# Patient Record
Sex: Female | Born: 2000 | Race: Black or African American | Hispanic: No | Marital: Single | Smoking: Current every day smoker
Health system: Southern US, Community
[De-identification: ages and names within clinical notes are randomized; demographics above are authoritative.]

---
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
## 2018-03-02 ENCOUNTER — Emergency Department (HOSPITAL_COMMUNITY)
Admission: EM | Admit: 2018-03-02 | Discharge: 2018-03-02 | Disposition: A | Discharge status: Home / Self Care | Payer: Medicaid Other | Attending: Emergency Medicine | Admitting: Emergency Medicine

## 2018-03-02 DIAGNOSIS — Y939 Activity, unspecified: Secondary | ICD-10-CM | POA: Insufficient documentation

## 2018-03-02 DIAGNOSIS — Y998 Other external cause status: Secondary | ICD-10-CM | POA: Insufficient documentation

## 2018-03-02 DIAGNOSIS — Y92410 Unspecified street and highway as the place of occurrence of the external cause: Secondary | ICD-10-CM | POA: Diagnosis not present

## 2018-03-02 DIAGNOSIS — M542 Cervicalgia: Secondary | ICD-10-CM | POA: Diagnosis not present

## 2018-03-02 DIAGNOSIS — Z79899 Other long term (current) drug therapy: Secondary | ICD-10-CM | POA: Insufficient documentation

## 2018-03-02 DIAGNOSIS — F17210 Nicotine dependence, cigarettes, uncomplicated: Secondary | ICD-10-CM | POA: Insufficient documentation

## 2018-03-02 MED ORDER — CYCLOBENZAPRINE HCL 10 MG PO TABS: 5.0000 mg | ORAL_TABLET | Freq: Two times a day (BID) | ORAL | 0 refills | Status: DC | PRN
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
## 2018-03-02 NOTE — Discharge Instructions (Signed)
Take tylenol for your pain.  Return to the emergency department immediately if you develop any of the following symptoms: You have numbness, tingling, or weakness in the arms or legs. You develop severe headaches not relieved with medicine. You have severe neck pain, especially tenderness in the middle of the back of your neck. You have changes in bowel or bladder control. There is increasing pain in any area of the body. You have shortness of breath, light-headedness, dizziness, or fainting. You have chest pain. You feel sick to your stomach (nauseous), throw up (vomit), or sweat. You have increasing abdominal discomfort. There is blood in your urine, stool, or vomit. You have pain in your shoulder (shoulder strap areas). You feel your symptoms are getting worse.
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
## 2019-07-11 ENCOUNTER — Emergency Department (HOSPITAL_COMMUNITY): Payer: Medicaid Other

## 2019-07-11 ENCOUNTER — Ambulatory Visit (HOSPITAL_COMMUNITY)
Admission: EM | Admit: 2019-07-11 | Discharge: 2019-07-11 | Disposition: A | Discharge status: Home / Self Care | Payer: Medicaid Other | Attending: Family Medicine | Admitting: Family Medicine

## 2019-07-11 ENCOUNTER — Emergency Department (HOSPITAL_COMMUNITY)
Admission: EM | Admit: 2019-07-11 | Discharge: 2019-07-12 | Disposition: A | Discharge status: Other Acute Inpatient Hospital | Payer: Medicaid Other | Attending: Emergency Medicine | Admitting: Emergency Medicine

## 2019-07-11 ENCOUNTER — Other Ambulatory Visit: Payer: Self-pay

## 2019-07-11 ENCOUNTER — Encounter (HOSPITAL_COMMUNITY): Payer: Self-pay

## 2019-07-11 DIAGNOSIS — Z20822 Contact with and (suspected) exposure to covid-19: Secondary | ICD-10-CM | POA: Diagnosis not present

## 2019-07-11 DIAGNOSIS — R1011 Right upper quadrant pain: Secondary | ICD-10-CM

## 2019-07-11 DIAGNOSIS — F329 Major depressive disorder, single episode, unspecified: Secondary | ICD-10-CM

## 2019-07-11 DIAGNOSIS — R160 Hepatomegaly, not elsewhere classified: Secondary | ICD-10-CM | POA: Insufficient documentation

## 2019-07-11 DIAGNOSIS — F17210 Nicotine dependence, cigarettes, uncomplicated: Secondary | ICD-10-CM | POA: Insufficient documentation

## 2019-07-11 DIAGNOSIS — R45851 Suicidal ideations: Secondary | ICD-10-CM | POA: Diagnosis present

## 2019-07-11 DIAGNOSIS — F32A Depression, unspecified: Secondary | ICD-10-CM

## 2019-07-11 DIAGNOSIS — Z3202 Encounter for pregnancy test, result negative: Secondary | ICD-10-CM

## 2019-07-11 DIAGNOSIS — F332 Major depressive disorder, recurrent severe without psychotic features: Secondary | ICD-10-CM | POA: Insufficient documentation

## 2019-07-11 DIAGNOSIS — G8929 Other chronic pain: Secondary | ICD-10-CM | POA: Diagnosis present

## 2019-07-11 DIAGNOSIS — R1013 Epigastric pain: Secondary | ICD-10-CM | POA: Insufficient documentation

## 2019-07-11 LAB — COMPREHENSIVE METABOLIC PANEL
ALT: 14 U/L (ref 0–44)
AST: 17 U/L (ref 15–41)
Albumin: 4.9 g/dL (ref 3.5–5.0)
Alkaline Phosphatase: 62 U/L (ref 38–126)
Anion gap: 8 (ref 5–15)
BUN: <5 mg/dL — ABNORMAL LOW (ref 6–20)
CO2: 23 mmol/L (ref 22–32)
Calcium: 9.6 mg/dL (ref 8.9–10.3)
Chloride: 109 mmol/L (ref 98–111)
Creatinine, Ser: 0.72 mg/dL (ref 0.44–1.00)
GFR calc Af Amer: >60 mL/min (ref >60)
GFR calc non Af Amer: >60 mL/min (ref >60)
Glucose, Bld: 89 mg/dL (ref 70–99)
Potassium: 3.5 mmol/L (ref 3.5–5.1)
Sodium: 140 mmol/L (ref 135–145)
Total Bilirubin: 0.5 mg/dL (ref 0.3–1.2)
Total Protein: 8 g/dL (ref 6.5–8.1)

## 2019-07-11 LAB — ETHANOL: Alcohol, Ethyl (B): <10 mg/dL (ref <10)

## 2019-07-11 LAB — POCT URINALYSIS DIP (DEVICE)
Bilirubin Urine: NEGATIVE
Glucose, UA: NEGATIVE mg/dL
Ketones, ur: NEGATIVE mg/dL
Leukocytes,Ua: NEGATIVE
Nitrite: NEGATIVE
Protein, ur: NEGATIVE mg/dL
Specific Gravity, Urine: >=1.03 (ref 1.005–1.030)
Urobilinogen, UA: 0.2 mg/dL (ref 0.0–1.0)
pH: 6.5 (ref 5.0–8.0)

## 2019-07-11 LAB — CBC
HCT: 44.4 % (ref 36.0–46.0)
Hemoglobin: 14.3 g/dL (ref 12.0–15.0)
MCH: 31.6 pg (ref 26.0–34.0)
MCHC: 32.2 g/dL (ref 30.0–36.0)
MCV: 98.2 fL (ref 80.0–100.0)
Platelets: 493 10*3/uL — ABNORMAL HIGH (ref 150–400)
RBC: 4.52 MIL/uL (ref 3.87–5.11)
RDW: 12 % (ref 11.5–15.5)
WBC: 5.3 10*3/uL (ref 4.0–10.5)
nRBC: 0 % (ref 0.0–0.2)

## 2019-07-11 LAB — SALICYLATE LEVEL: Salicylate Lvl: <7 mg/dL — ABNORMAL LOW (ref 7.0–30.0)

## 2019-07-11 LAB — I-STAT BETA HCG BLOOD, ED (MC, WL, AP ONLY): I-stat hCG, quantitative: <5 m[IU]/mL (ref <5)

## 2019-07-11 LAB — POCT PREGNANCY, URINE: Preg Test, Ur: NEGATIVE

## 2019-07-11 LAB — ACETAMINOPHEN LEVEL: Acetaminophen (Tylenol), Serum: <10 ug/mL — ABNORMAL LOW (ref 10–30)

## 2019-07-11 LAB — POC URINE PREG, ED: Preg Test, Ur: NEGATIVE

## 2019-07-11 MED ORDER — CYCLOBENZAPRINE HCL 10 MG PO TABS: 5.0000 mg | ORAL_TABLET | Freq: Two times a day (BID) | ORAL | Status: DC | PRN

## 2019-07-11 MED ORDER — ALUM & MAG HYDROXIDE-SIMETH 200-200-20 MG/5ML PO SUSP
30.0000 mL | Freq: Once | ORAL | Status: AC
  Administered 2019-07-11: 30 mL via ORAL
  Filled 2019-07-11: qty 30

## 2019-07-11 MED ORDER — QUETIAPINE FUMARATE 50 MG PO TABS
250.0000 mg | ORAL_TABLET | Freq: Every day | ORAL | Status: DC
  Administered 2019-07-12: 250 mg via ORAL
  Filled 2019-07-11: qty 2

## 2019-07-11 MED ORDER — TRAZODONE HCL 100 MG PO TABS: 100.0000 mg | ORAL_TABLET | Freq: Every evening | ORAL | Status: DC | PRN

## 2019-07-11 MED ORDER — LIDOCAINE VISCOUS HCL 2 % MT SOLN
15.0000 mL | Freq: Once | OROMUCOSAL | Status: AC
  Administered 2019-07-11: 15 mL via ORAL
  Filled 2019-07-11: qty 15

## 2019-07-11 MED ORDER — FLUOXETINE HCL 20 MG PO CAPS
20.0000 mg | ORAL_CAPSULE | Freq: Every day | ORAL | Status: DC
  Administered 2019-07-12: 20 mg via ORAL
  Filled 2019-07-11: qty 1

## 2019-07-11 MED ORDER — ACETAMINOPHEN 500 MG PO TABS
1000.0000 mg | ORAL_TABLET | Freq: Once | ORAL | Status: AC
  Administered 2019-07-11: 1000 mg via ORAL
  Filled 2019-07-11: qty 2

## 2019-07-11 MED ORDER — HYDROXYZINE HCL 25 MG PO TABS: 25.0000 mg | ORAL_TABLET | Freq: Four times a day (QID) | ORAL | Status: DC | PRN

## 2019-07-11 NOTE — ED Triage Notes (Cosign Needed)
Pt present abdominal pain with urinary frequency. Symptom been going on for over 5 months. Pt states she is having some burning while urinating and  A bowel movement 2x a week.

## 2019-07-11 NOTE — ED Triage Notes (Signed)
Patient reports she is suicidal, but has no plan. Patient then mentioned she went to the [facility] yesterday, but would not elaborate.  Patient c/o lower abdominal pain x 5 months. Paient also c/o dysuria x 5 months. Patient states she has not seen about this.

## 2019-07-11 NOTE — ED Provider Notes (Signed)
[facility]-EMERGENCY DEPT [facility] Emergency Department Provider Note MRN:  [id number]  Arrival date & time: 07/11/19     Chief Complaint   Suicidal and Abdominal Pain   History of Present Illness   Paula Gonzalez is a 19 y.o. year-old female with no pertinent past medical history presenting to the ED with chief complaint of abdominal pain.  At least 3 months of abdominal pain that is worst in the right upper quadrant.  Intermittent.  Her pain has been causing her to have depression.  Noted some thoughts of suicide with urgent care provider earlier today.  Denies fever, nausea but no vomiting.  Patient is withdrawn and not open to answering many questions.  I was unable to obtain an accurate HPI, PMH, or ROS due to the patient's depression.  Level 5 caveat.  Review of Systems  Positive for abdominal pain, depression, suicidality.  Patient's Health History   History reviewed. No pertinent past medical history.  History reviewed. No pertinent surgical history.  Family History  Family history unknown: Yes    Social History   Socioeconomic History  . Marital status: Single    Spouse name: Not on file  . Number of children: Not on file  . Years of education: Not on file  . Highest education level: Not on file  Occupational History  . Not on file  Tobacco Use  . Smoking status: Current Every Day Smoker    Packs/day: 0.50    Types: Cigarettes  . Smokeless tobacco: Never Used  Substance and Sexual Activity  . Alcohol use: Yes    Comment: drink socially  . Drug use: Yes    Types: Marijuana  . Sexual activity: Not on file  Other Topics Concern  . Not on file  Social History Narrative  . Not on file   Social Determinants of Health   Financial Resource Strain:   . Difficulty of Paying Living Expenses: Not on file  Food Insecurity:   . Worried About Programme researcher, broadcasting/film/video in the Last Year: Not on file  . Ran Out of Food in the Last Year: Not on file  Transportation Needs:    . Lack of Transportation (Medical): Not on file  . Lack of Transportation (Non-Medical): Not on file  Physical Activity:   . Days of Exercise per Week: Not on file  . Minutes of Exercise per Session: Not on file  Stress:   . Feeling of Stress : Not on file  Social Connections:   . Frequency of Communication with Friends and Family: Not on file  . Frequency of Social Gatherings with Friends and Family: Not on file  . Attends Religious Services: Not on file  . Active Member of Clubs or Organizations: Not on file  . Attends Banker Meetings: Not on file  . Marital Status: Not on file  Intimate Partner Violence:   . Fear of Current or Ex-Partner: Not on file  . Emotionally Abused: Not on file  . Physically Abused: Not on file  . Sexually Abused: Not on file     Physical Exam   Vitals:   07/11/19 1403  BP: 122/82  Pulse: (!) 115  Resp: 16  Temp: 98.5 F (36.9 C)  SpO2: 100%    CONSTITUTIONAL: Well-appearing, NAD NEURO:  Alert and oriented x 3, no focal deficits EYES:  eyes equal and reactive ENT/NECK:  no LAD, no JVD CARDIO: Tachycardic rate, well-perfused, normal S1 and S2 PULM:  CTAB no wheezing or rhonchi  GI/GU:  normal bowel sounds, non-distended, mild right upper quadrant tenderness to palpation MSK/SPINE:  No gross deformities, no edema SKIN:  no rash, atraumatic PSYCH:  Appropriate speech and behavior  *Additional and/or pertinent findings included in MDM below  Diagnostic and Interventional Summary    EKG Interpretation  Date/Time:    Ventricular Rate:    PR Interval:    QRS Duration:   QT Interval:    QTC Calculation:   R Axis:     Text Interpretation:        Cardiac Monitoring Interpretation:  Labs Reviewed  COMPREHENSIVE METABOLIC PANEL - Abnormal; Notable for the following components:      Result Value   BUN <5 (*)    All other components within normal limits  SALICYLATE LEVEL - Abnormal; Notable for the following components:    Salicylate Lvl <7.4 (*)    All other components within normal limits  ACETAMINOPHEN LEVEL - Abnormal; Notable for the following components:   Acetaminophen (Tylenol), Serum <10 (*)    All other components within normal limits  CBC - Abnormal; Notable for the following components:   Platelets 493 (*)    All other components within normal limits  ETHANOL  RAPID URINE DRUG SCREEN, HOSP PERFORMED  URINALYSIS, ROUTINE W REFLEX MICROSCOPIC  LIPASE, BLOOD  I-STAT BETA HCG BLOOD, ED ([facility], [facility], [facility] ONLY)    US Abdomen Limited RUQ  Final Result      Medications  acetaminophen (TYLENOL) tablet 1,000 mg (1,000 mg Oral Given 07/11/19 1605)  alum & mag hydroxide-simeth (MAALOX/MYLANTA) 200-200-20 MG/5ML suspension 30 mL (30 mLs Oral Given 07/11/19 1605)    And  lidocaine (XYLOCAINE) 2 % viscous mouth solution 15 mL (15 mLs Oral Given 07/11/19 1605)     Procedures  /  Critical Care Procedures  ED Course and Medical Decision Making  I have reviewed the triage vital signs, the nursing notes, and pertinent available records from the EMR.  Pertinent labs & imaging results that were available during my care of the patient were reviewed by me and considered in my medical decision making (see below for details).     Awaiting laboratory assessment to exclude liver function impairment, will also obtain right upper quadrant ultrasound to exclude biliary stones as the cause of patient's chronic intermittent pain.  Without a significant medical diagnosis after this work-up, will defer to TTS for management of depression with suicidal thoughts.  5:26 PM update: Work-up is reassuring, no acute medical process evident.  No evidence of biliary colic or cholecystitis on ultrasound.  Incidental liver nodule or mass, likely hemangioma.  Patient made aware of this finding and need for outpatient MRI imaging.  Will refer to GI.  Patient is now appropriate for TTS evaluation.  Signed out to default provider.  Barth Kirks.  Sedonia Small, [place] [email or web address]  Final Clinical Impressions(s) / ED Diagnoses     ICD-10-CM   1. Depression, unspecified depression type  F32.9   2. RUQ pain  R10.11 US Abdomen Limited RUQ    US Abdomen Limited RUQ  3. Suicidal ideation  R45.851   4. Liver mass  R16.0     ED Discharge Orders    None       Discharge Instructions Discussed with and Provided to Patient:   Discharge Instructions   None       Maudie Flakes, MD 07/11/19 1729
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
## 2019-07-11 NOTE — ED Notes (Signed)
Patient has been wanded my security. Patient has one belonging back behind the triage desk

## 2019-07-11 NOTE — ED Notes (Signed)
Pt continues to stay balled up in chair, no s/s of distress. Continue to observe

## 2019-07-11 NOTE — ED Notes (Signed)
US at bedside

## 2019-07-11 NOTE — ED Provider Notes (Signed)
[place]   [id number] 07/11/19 Arrival Time: 1110  ASSESSMENT & PLAN:  1. Severe episode of recurrent major depressive disorder, without psychotic features ([place])   2. Passive suicidal ideations   3. Abdominal pain, chronic, epigastric     Initial conversation regarding abdominal symptoms quickly deteriorated into Ms Pozo expressing thoughts of committing suicide. Reports "I just feel tired and don't want to talk anymore". After this she does not answer many of my questions and refuses to make a verbal contract with me in regards to seeking mental heath care. No specific suicidal plan reported. Her mother was brought to the room where the current situation was explained. She agrees to take her directly to [facility] ED for evaluation. Agrees to not leave Arriyah alone. Discharged into mother's care.    Follow-up Information    Schedule an appointment as soon as possible for a visit  with Gastroenterology, [facility].   Contact information: [place] [id number] [phone]        Schedule an appointment as soon as possible for a visit  with [facility] Gastroenterology.   Specialty: Gastroenterology Contact information: [place] [id number] [phone]       Go to  [place] DEPT.   Specialty: Emergency Medicine Contact information: [place] [id number] [place] [phone]          Reviewed expectations re: course of current medical issues. Questions answered. Outlined signs and symptoms indicating need for more acute intervention. Understanding verbalized. After Visit Summary given.   SUBJECTIVE: History from: patient and caregiver. Kamariyah Timberlake is a 19 y.o. female who reports 5-6 months of chronic abdominal pain and urinary frequency. No recent medical records available here but she reports seeing multiple medical  providers over the past few months including gyn and urology. "They say everything is ok". Reports being frustrated with abdominal pains that limit her at work. Not able to perform her job well. Chart review reveals history of major depression. She nods here head yes when asked if she feels depressed. No currrent street drug use reported. Occasional alcohol use.  Patient's last menstrual period was 06/17/2019.   OBJECTIVE:  Vitals:   07/11/19 1135  BP: 121/71  Pulse: 60  Resp: 16  Temp: 98.3 F (36.8 C)  TempSrc: Oral  SpO2: 100%    General appearance: alert; tearful and quiet spoken Eyes: PERRLA; EOMI; conjunctiva normal HENT: [place]; AT; nasal mucosa normal; oral mucosa normal Neck: supple  Lungs: speaks full sentences without difficulty; unlabored Abdomen: soft with lower TTP reported; poorly localized; no rebound TTP or guarding Extremities: no edema Skin: warm and dry Neurologic: normal gait Psychological: alert and cooperative  Labs: Results for orders placed or performed during the hospital encounter of 07/11/19  POC urine pregnancy  Result Value Ref Range   Preg Test, Ur NEGATIVE NEGATIVE  POCT urinalysis dip (device)  Result Value Ref Range   Glucose, UA NEGATIVE NEGATIVE mg/dL   Bilirubin Urine NEGATIVE NEGATIVE   Ketones, ur NEGATIVE NEGATIVE mg/dL   Specific Gravity, Urine >=1.030 1.005 - 1.030   Hgb urine dipstick TRACE (A) NEGATIVE   pH 6.5 5.0 - 8.0   Protein, ur NEGATIVE NEGATIVE mg/dL   Urobilinogen, UA 0.2 0.0 - 1.0 mg/dL   Nitrite NEGATIVE NEGATIVE   Leukocytes,Ua NEGATIVE NEGATIVE  Pregnancy, urine POC  Result Value Ref Range   Preg Test, Ur NEGATIVE NEGATIVE   Labs  Reviewed  POCT URINALYSIS DIP (DEVICE) - Abnormal; Notable for the following components:      Result Value   Hgb urine dipstick TRACE (*)    All other components within normal limits  URINE CULTURE  POC URINE PREG, ED  POCT PREGNANCY, URINE    No Known Allergies    Social  History   Socioeconomic History  . Marital status: Single    Spouse name: Not on file  . Number of children: Not on file  . Years of education: Not on file  . Highest education level: Not on file  Occupational History  . Not on file  Tobacco Use  . Smoking status: Current Every Day Smoker    Types: Cigarettes  . Smokeless tobacco: Never Used  Substance and Sexual Activity  . Alcohol use: Yes    Comment: drink socially  . Drug use: Yes    Types: Marijuana  . Sexual activity: Not on file  Other Topics Concern  . Not on file  Social History Narrative  . Not on file   Social Determinants of Health   Financial Resource Strain:   . Difficulty of Paying Living Expenses: Not on file  Food Insecurity:   . Worried About Programme researcher, broadcasting/film/video in the Last Year: Not on file  . Ran Out of Food in the Last Year: Not on file  Transportation Needs:   . Lack of Transportation (Medical): Not on file  . Lack of Transportation (Non-Medical): Not on file  Physical Activity:   . Days of Exercise per Week: Not on file  . Minutes of Exercise per Session: Not on file  Stress:   . Feeling of Stress : Not on file  Social Connections:   . Frequency of Communication with Friends and Family: Not on file  . Frequency of Social Gatherings with Friends and Family: Not on file  . Attends Religious Services: Not on file  . Active Member of Clubs or Organizations: Not on file  . Attends Banker Meetings: Not on file  . Marital Status: Not on file  Intimate Partner Violence:   . Fear of Current or Ex-Partner: Not on file  . Emotionally Abused: Not on file  . Physically Abused: Not on file  . Sexually Abused: Not on file   History reviewed. No pertinent family history. History reviewed. No pertinent surgical history.   Mardella Layman, MD 07/11/19 1425

## 2019-07-12 ENCOUNTER — Inpatient Hospital Stay (HOSPITAL_COMMUNITY)
Admission: AD | Admit: 2019-07-12 | Discharge: 2019-07-17 | DRG: 897 | Disposition: A | Discharge status: Home / Self Care | Payer: Medicaid Other | Source: Intra-hospital | Attending: Psychiatry | Admitting: Psychiatry

## 2019-07-12 ENCOUNTER — Encounter (HOSPITAL_COMMUNITY): Payer: Self-pay | Admitting: Family

## 2019-07-12 ENCOUNTER — Other Ambulatory Visit: Payer: Self-pay

## 2019-07-12 DIAGNOSIS — F418 Other specified anxiety disorders: Secondary | ICD-10-CM | POA: Diagnosis present

## 2019-07-12 DIAGNOSIS — F1994 Other psychoactive substance use, unspecified with psychoactive substance-induced mood disorder: Secondary | ICD-10-CM | POA: Diagnosis present

## 2019-07-12 DIAGNOSIS — Z915 Personal history of self-harm: Secondary | ICD-10-CM | POA: Diagnosis not present

## 2019-07-12 DIAGNOSIS — K589 Irritable bowel syndrome without diarrhea: Secondary | ICD-10-CM | POA: Diagnosis present

## 2019-07-12 DIAGNOSIS — F332 Major depressive disorder, recurrent severe without psychotic features: Secondary | ICD-10-CM | POA: Diagnosis not present

## 2019-07-12 DIAGNOSIS — R45851 Suicidal ideations: Secondary | ICD-10-CM | POA: Diagnosis present

## 2019-07-12 DIAGNOSIS — F17210 Nicotine dependence, cigarettes, uncomplicated: Secondary | ICD-10-CM | POA: Diagnosis present

## 2019-07-12 DIAGNOSIS — F4310 Post-traumatic stress disorder, unspecified: Secondary | ICD-10-CM | POA: Diagnosis present

## 2019-07-12 DIAGNOSIS — F1220 Cannabis dependence, uncomplicated: Secondary | ICD-10-CM | POA: Diagnosis present

## 2019-07-12 DIAGNOSIS — F12250 Cannabis dependence with psychotic disorder with delusions: Principal | ICD-10-CM | POA: Diagnosis present

## 2019-07-12 DIAGNOSIS — R160 Hepatomegaly, not elsewhere classified: Secondary | ICD-10-CM | POA: Diagnosis not present

## 2019-07-12 DIAGNOSIS — F23 Brief psychotic disorder: Secondary | ICD-10-CM | POA: Diagnosis present

## 2019-07-12 DIAGNOSIS — F22 Delusional disorders: Secondary | ICD-10-CM | POA: Diagnosis present

## 2019-07-12 LAB — URINALYSIS, ROUTINE W REFLEX MICROSCOPIC
Bilirubin Urine: NEGATIVE
Glucose, UA: NEGATIVE mg/dL
Hgb urine dipstick: NEGATIVE
Ketones, ur: 5 mg/dL — AB
Leukocytes,Ua: NEGATIVE
Nitrite: NEGATIVE
Protein, ur: NEGATIVE mg/dL
Specific Gravity, Urine: 1.019 (ref 1.005–1.030)
pH: 5 (ref 5.0–8.0)

## 2019-07-12 LAB — URINE CULTURE: Culture: <10000 — AB

## 2019-07-12 LAB — RESPIRATORY PANEL BY RT PCR (FLU A&B, COVID)
Influenza A by PCR: NEGATIVE
Influenza B by PCR: NEGATIVE
SARS Coronavirus 2 by RT PCR: NEGATIVE

## 2019-07-12 LAB — RAPID URINE DRUG SCREEN, HOSP PERFORMED
Amphetamines: NOT DETECTED
Barbiturates: NOT DETECTED
Benzodiazepines: NOT DETECTED
Cocaine: NOT DETECTED
Opiates: NOT DETECTED
Tetrahydrocannabinol: POSITIVE — AB

## 2019-07-12 LAB — LIPASE, BLOOD: Lipase: 27 U/L (ref 11–51)

## 2019-07-12 MED ORDER — PANTOPRAZOLE SODIUM 20 MG PO TBEC
20.0000 mg | DELAYED_RELEASE_TABLET | Freq: Every day | ORAL | Status: DC
  Administered 2019-07-13 – 2019-07-14 (×2): 20 mg via ORAL
  Filled 2019-07-12 (×5): qty 1

## 2019-07-12 MED ORDER — POLYETHYLENE GLYCOL 3350 17 G PO PACK
17.0000 g | PACK | Freq: Every day | ORAL | Status: DC | PRN
  Filled 2019-07-12: qty 1

## 2019-07-12 MED ORDER — DOCUSATE SODIUM 100 MG PO CAPS
100.0000 mg | ORAL_CAPSULE | Freq: Every day | ORAL | Status: DC
  Administered 2019-07-12: 100 mg via ORAL
  Filled 2019-07-12: qty 1

## 2019-07-12 MED ORDER — DOCUSATE SODIUM 100 MG PO CAPS
100.0000 mg | ORAL_CAPSULE | Freq: Every day | ORAL | Status: DC
  Administered 2019-07-13 – 2019-07-17 (×4): 100 mg via ORAL
  Filled 2019-07-12 (×7): qty 1

## 2019-07-12 MED ORDER — ACETAMINOPHEN 325 MG PO TABS
650.0000 mg | ORAL_TABLET | Freq: Four times a day (QID) | ORAL | Status: DC | PRN
  Administered 2019-07-13: 650 mg via ORAL
  Filled 2019-07-12: qty 2

## 2019-07-12 MED ORDER — ONDANSETRON HCL 4 MG PO TABS
4.0000 mg | ORAL_TABLET | Freq: Three times a day (TID) | ORAL | Status: DC | PRN
  Filled 2019-07-12: qty 1

## 2019-07-12 MED ORDER — ACETAMINOPHEN 500 MG PO TABS
1000.0000 mg | ORAL_TABLET | Freq: Once | ORAL | Status: AC
  Administered 2019-07-12: 1000 mg via ORAL
  Filled 2019-07-12: qty 2

## 2019-07-12 MED ORDER — HYDROXYZINE HCL 25 MG PO TABS
25.0000 mg | ORAL_TABLET | Freq: Four times a day (QID) | ORAL | Status: DC | PRN
  Administered 2019-07-12: 25 mg via ORAL
  Filled 2019-07-12: qty 1

## 2019-07-12 MED ORDER — MIRTAZAPINE 15 MG PO TABS
15.0000 mg | ORAL_TABLET | Freq: Every day | ORAL | Status: DC
  Administered 2019-07-12 – 2019-07-13 (×2): 15 mg via ORAL
  Filled 2019-07-12 (×5): qty 1

## 2019-07-12 MED ORDER — ONDANSETRON 4 MG PO TBDP
4.0000 mg | ORAL_TABLET | Freq: Three times a day (TID) | ORAL | Status: DC | PRN
  Administered 2019-07-12: 4 mg via ORAL
  Filled 2019-07-12: qty 1

## 2019-07-12 MED ORDER — DICYCLOMINE HCL 10 MG PO CAPS
10.0000 mg | ORAL_CAPSULE | Freq: Three times a day (TID) | ORAL | Status: DC
  Administered 2019-07-13 – 2019-07-14 (×6): 10 mg via ORAL
  Filled 2019-07-12 (×15): qty 1

## 2019-07-12 MED ORDER — ONDANSETRON 4 MG PO TBDP
4.0000 mg | ORAL_TABLET | Freq: Once | ORAL | Status: AC
  Administered 2019-07-12: 4 mg via ORAL
  Filled 2019-07-12: qty 1

## 2019-07-12 MED ORDER — POLYETHYLENE GLYCOL 3350 17 G PO PACK
17.0000 g | PACK | Freq: Every day | ORAL | Status: DC | PRN
  Administered 2019-07-13: 17 g via ORAL
  Filled 2019-07-12: qty 1

## 2019-07-12 NOTE — ED Notes (Signed)
I called patients mother Magda Paganini [phone]) and let her know that patient will be transported to [facility] at 4:00.  Mother was very appreciative for the phone call.

## 2019-07-12 NOTE — Progress Notes (Signed)
Received Paula Gonzalez this PM in her room asleep with the sitter at the bedside. Her mom called and spoke with her and later this writer spoke with her briefly. Mom  is very concerned about her abdomen pain which has cause her to feel depressed. She slept throughout the night.

## 2019-07-12 NOTE — Progress Notes (Signed)
Pt is a 19 year old female who was admitted to room 304-1.  Pt transferred to [facility] from [facility] ED.  Pt reported that she has had 6 months of undiagnosed abdominal pain, constipation, vomiting, and poor appetite.  Pt also complains of burning when urinating.    Pt's ongoing medical issues has caused a significant decline in pt's mood and mental stability.  Pt reports passive SI, with no plan or intent. Water offered, pt declined food. Pt vomited x1 during assessment.  Skin assessment could not be performed due to pt's vomiting.    Pt escorted to room.  Safety checks were initiated.  Admission paperwork was signed by pt, and pt verbalized understanding of plan of care.

## 2019-07-12 NOTE — ED Provider Notes (Signed)
Pt held in ED awaiting psych recommendations. During my shift, I ordered meds for constipation treatment, nausea, and pain. Pt sleeping on exam.  Psychiatry is recommending inpatient treatment. PT will be taken to [facility] once bed is ready.   Lillyanna Glandon, Ambrose Finland, MD 07/12/19 1515

## 2019-07-12 NOTE — Progress Notes (Signed)
Psychoeducational Group Note  Date:  07/12/2019 Time:  2141  Group Topic/Focus:  Wrap-Up Group:   The focus of this group is to help patients review their daily goal of treatment and discuss progress on daily workbooks.  Participation Level: Did Not Attend  Participation Quality:  Not Applicable  Affect:  Not Applicable  Cognitive:  Not Applicable  Insight:  Not Applicable  Engagement in Group: Not Applicable  Additional Comments:  The patient did not attend group this evening since she was not feeling well.   Hazle Coca S 07/12/2019, 9:41 PM

## 2019-07-12 NOTE — ED Notes (Signed)
Renaye Rakers, NP, recommends overnight observation for safety and stabilization with psych consult in the AM. Per Hassie Bruce, patient accepted to [facility] Observation Unit pending negative COVID test.

## 2019-07-12 NOTE — BH Assessment (Signed)
[facility] Assessment Progress Note  Per Berneice Heinrich, FNP, this pt requires psychiatric hospitalization at this time.  Jasmine has assigned pt to [facility] Rm 304-1; [facility] will be ready to receive pt after 16:00.  Pt has reluctantly signed Voluntary Admission and Consent for Treatment, as well as Consent to Release Information to her mother, and a notification call has has been placed.  Signed forms have been faxed to [facility].  Pt's nurse, Kendal Hymen, has been notified, and agrees to send original paperwork along with pt via [employer], and to call report to [phone].  Doylene Canning, [place] Behavioral Health Coordinator [phone]

## 2019-07-12 NOTE — Tx Team (Signed)
Initial Treatment Plan 07/12/2019 6:55 PM Howell Rucks [id number]    PATIENT STRESSORS: Health problems   PATIENT STRENGTHS: Ability for insight Average or above average intelligence Supportive family/friends   PATIENT IDENTIFIED PROBLEMS: Depression and Anxiety  Medical issues                   DISCHARGE CRITERIA:  Improved stabilization in mood, thinking, and/or behavior Medical problems require only outpatient monitoring Motivation to continue treatment in a less acute level of care  PRELIMINARY DISCHARGE PLAN: Outpatient therapy Return to previous living arrangement Return to previous work or school arrangements  PATIENT/FAMILY INVOLVEMENT: This treatment plan has been presented to and reviewed with the patient, Paula Gonzalez.  The patient has been given the opportunity to ask questions and make suggestions.  Garnette Scheuermann, RN 07/12/2019, 6:55 PM

## 2019-07-12 NOTE — Progress Notes (Signed)
Pt has been having GI issues since before beginning of the shift. Pt had 2 episodes of clear emesis. Pt was given a little ginger ale and water and was not able to keep it down.  Pt will be given zofran and will monitor. 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 Pt stated she has not had anything to eat since Sunday. Pt has had very minimal fluids in the same timeframe.

## 2019-07-13 ENCOUNTER — Inpatient Hospital Stay (HOSPITAL_COMMUNITY): Payer: Medicaid Other

## 2019-07-13 DIAGNOSIS — F332 Major depressive disorder, recurrent severe without psychotic features: Secondary | ICD-10-CM

## 2019-07-13 MED ORDER — GADOBUTROL 1 MMOL/ML IV SOLN
6.0000 mL | Freq: Once | INTRAVENOUS | Status: AC | PRN
  Administered 2019-07-13: 6 mL via INTRAVENOUS
  Filled 2019-07-13: qty 6
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
## 2019-07-13 NOTE — Progress Notes (Signed)
   07/13/19 2300  Psych Admission Type (Psych Patients Only)  Admission Status Voluntary  Psychosocial Assessment  Patient Complaints Anxiety  Eye Contact Avoids  Facial Expression Anxious;Flat;Sad  Affect Anxious;Flat  Speech Soft  Interaction Avoidant  Motor Activity Slow  Appearance/Hygiene In scrubs  Behavior Characteristics Anxious  Mood Depressed  Aggressive Behavior  Effect No apparent injury  Thought Process  Coherency Circumstantial  Content Preoccupation  Delusions None reported or observed  Perception WDL  Hallucination None reported or observed  Judgment Impaired  Confusion None  Danger to Self  Current suicidal ideation? Denies  Self-Injurious Behavior No self-injurious ideation or behavior indicators observed or expressed   Agreement Not to Harm Self Yes  Description of Agreement verbally contracts for safety  Danger to Others  Danger to Others None reported or observed

## 2019-07-13 NOTE — Progress Notes (Signed)
NP- Adaku ordered GI consult for pt due to pt not eating or drinking for 3 days due to not being able to keep anything down.   Per ED physician note:   Incidental liver nodule or mass, likely hemangioma.  Patient made aware of this finding and need for outpatient MRI imaging.  Will refer to GI.
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
## 2019-07-13 NOTE — Plan of Care (Signed)
Nurse discussed anxiety, depression and coping skills with patient.  

## 2019-07-14 DIAGNOSIS — F22 Delusional disorders: Secondary | ICD-10-CM | POA: Diagnosis present

## 2019-07-14 DIAGNOSIS — F1994 Other psychoactive substance use, unspecified with psychoactive substance-induced mood disorder: Secondary | ICD-10-CM

## 2019-07-14 MED ORDER — QUETIAPINE FUMARATE 50 MG PO TABS
50.0000 mg | ORAL_TABLET | Freq: Three times a day (TID) | ORAL | Status: DC
  Filled 2019-07-14 (×8): qty 1

## 2019-07-14 MED ORDER — MIRTAZAPINE 30 MG PO TABS
30.0000 mg | ORAL_TABLET | Freq: Every day | ORAL | Status: DC
  Filled 2019-07-14 (×3): qty 1

## 2019-07-14 MED ORDER — OLANZAPINE 10 MG PO TBDP: 10.0000 mg | ORAL_TABLET | Freq: Three times a day (TID) | ORAL | Status: DC | PRN

## 2019-07-14 MED ORDER — DICYCLOMINE HCL 10 MG PO CAPS
20.0000 mg | ORAL_CAPSULE | Freq: Three times a day (TID) | ORAL | Status: DC
  Filled 2019-07-14 (×7): qty 2

## 2019-07-14 MED ORDER — ADULT MULTIVITAMIN W/MINERALS CH
1.0000 | ORAL_TABLET | Freq: Every day | ORAL | Status: DC
  Filled 2019-07-14 (×4): qty 1

## 2019-07-14 MED ORDER — LORAZEPAM 2 MG/ML IJ SOLN
2.0000 mg | Freq: Four times a day (QID) | INTRAMUSCULAR | Status: DC | PRN
  Administered 2019-07-14: 2 mg via INTRAMUSCULAR
  Filled 2019-07-14: qty 1

## 2019-07-14 MED ORDER — DIPHENHYDRAMINE HCL 50 MG/ML IJ SOLN
INTRAMUSCULAR | Status: AC
  Administered 2019-07-14: 50 mg
  Filled 2019-07-14: qty 1

## 2019-07-14 MED ORDER — QUETIAPINE FUMARATE 100 MG PO TABS
100.0000 mg | ORAL_TABLET | Freq: Every day | ORAL | Status: DC
  Filled 2019-07-14 (×2): qty 1

## 2019-07-14 MED ORDER — DICYCLOMINE HCL 20 MG PO TABS
20.0000 mg | ORAL_TABLET | Freq: Three times a day (TID) | ORAL | Status: DC
  Filled 2019-07-14 (×11): qty 1

## 2019-07-14 MED ORDER — SENNOSIDES-DOCUSATE SODIUM 8.6-50 MG PO TABS
2.0000 | ORAL_TABLET | Freq: Two times a day (BID) | ORAL | Status: DC
  Administered 2019-07-15 – 2019-07-17 (×4): 2 via ORAL
  Filled 2019-07-14 (×10): qty 2

## 2019-07-14 MED ORDER — DIPHENHYDRAMINE HCL 50 MG/ML IJ SOLN
50.0000 mg | Freq: Once | INTRAMUSCULAR | Status: AC
  Administered 2019-07-14: 50 mg via INTRAVENOUS
  Filled 2019-07-14: qty 1

## 2019-07-14 MED ORDER — BOOST / RESOURCE BREEZE PO LIQD CUSTOM
1.0000 | Freq: Two times a day (BID) | ORAL | Status: DC
  Administered 2019-07-14 – 2019-07-17 (×4): 1 via ORAL
  Filled 2019-07-14 (×11): qty 1

## 2019-07-14 MED ORDER — QUETIAPINE FUMARATE 50 MG PO TABS
50.0000 mg | ORAL_TABLET | ORAL | Status: AC
  Administered 2019-07-14: 50 mg via ORAL
  Filled 2019-07-14: qty 1

## 2019-07-14 MED ORDER — PANTOPRAZOLE SODIUM 40 MG PO TBEC
40.0000 mg | DELAYED_RELEASE_TABLET | Freq: Every day | ORAL | Status: DC
  Administered 2019-07-16 – 2019-07-17 (×2): 40 mg via ORAL
  Filled 2019-07-14 (×5): qty 1

## 2019-07-14 MED ORDER — ZIPRASIDONE MESYLATE 20 MG IM SOLR
20.0000 mg | INTRAMUSCULAR | Status: AC | PRN
  Administered 2019-07-14: 20 mg via INTRAMUSCULAR
  Filled 2019-07-14: qty 20

## 2019-07-14 MED ORDER — PSYLLIUM 95 % PO PACK
1.0000 | PACK | Freq: Every day | ORAL | Status: DC
  Administered 2019-07-14 – 2019-07-17 (×3): 1 via ORAL
  Filled 2019-07-14 (×6): qty 1

## 2019-07-14 MED ORDER — LORAZEPAM 1 MG PO TABS: 1.0000 mg | ORAL_TABLET | ORAL | Status: DC | PRN

## 2019-07-14 NOTE — Tx Team (Signed)
Interdisciplinary Treatment and Diagnostic Plan Update  07/14/2019 Time of Session: 9:45am Paula Gonzalez MRN: [id number]  Principal Diagnosis: <principal problem not specified>  Secondary Diagnoses: Active Problems:   MDD (major depressive disorder), recurrent episode, severe (HCC)   Current Medications:  Current Facility-Administered Medications  Medication Dose Route Frequency Provider Last Rate Last Admin  . acetaminophen (TYLENOL) tablet 650 mg  650 mg Oral Q6H PRN Anike, Adaku C, NP   650 mg at 07/13/19 0744  . dicyclomine (BENTYL) capsule 10 mg  10 mg Oral TID AC & HS Sharma Covert, MD   10 mg at 07/14/19 [phone]  . docusate sodium (COLACE) capsule 100 mg  100 mg Oral Daily Emmaline Kluver, FNP   100 mg at 07/13/19 0741  . hydrOXYzine (ATARAX/VISTARIL) tablet 25 mg  25 mg Oral Q6H PRN Emmaline Kluver, FNP   25 mg at 07/12/19 2200  . mirtazapine (REMERON) tablet 15 mg  15 mg Oral QHS Emmaline Kluver, FNP   15 mg at 07/13/19 2120  . ondansetron (ZOFRAN-ODT) disintegrating tablet 4 mg  4 mg Oral Q8H PRN Anike, Adaku C, NP   4 mg at 07/12/19 2125  . pantoprazole (PROTONIX) EC tablet 20 mg  20 mg Oral Daily Emmaline Kluver, FNP   20 mg at 07/13/19 0741  . polyethylene glycol (MIRALAX / GLYCOLAX) packet 17 g  17 g Oral Daily PRN Emmaline Kluver, FNP   17 g at 07/13/19 0747  . psyllium (HYDROCIL/METAMUCIL) packet 1 packet  1 packet Oral Daily Clary, Cordie Grice, MD       PTA Medications: Medications Prior to Admission  Medication Sig Dispense Refill Last Dose  . cyclobenzaprine (FLEXERIL) 10 MG tablet Take 0.5-1 tablets (5-10 mg total) by mouth 2 (two) times daily as needed for muscle spasms. (Patient not taking: Reported on 07/11/2019) 20 tablet 0   . FLUoxetine (PROZAC) 20 MG capsule Take 1 capsule (20 mg total) by mouth daily. (Patient not taking: Reported on 07/11/2019) 30 capsule 0   . hydrOXYzine (ATARAX/VISTARIL) 25 MG tablet Take 1 tablet (25 mg total) by mouth every 6 (six) hours as needed for  anxiety. (Patient not taking: Reported on 07/11/2019) 30 tablet 0   . ibuprofen (ADVIL) 200 MG tablet Take 200-800 mg by mouth every 6 (six) hours as needed for moderate pain.     Marland Kitchen QUEtiapine (SEROQUEL) 50 MG tablet Take 5 tablets (250 mg total) by mouth at bedtime. (Patient not taking: Reported on 07/11/2019) 150 tablet 0   . traZODone (DESYREL) 100 MG tablet Take 1 tablet (100 mg total) by mouth at bedtime as needed for sleep. (Patient not taking: Reported on 07/11/2019) 30 tablet 0     Patient Stressors: Health problems  Patient Strengths: Ability for insight Average or above average intelligence Supportive family/friends  Treatment Modalities: Medication Management, Group therapy, Case management,  1 to 1 session with clinician, Psychoeducation, Recreational therapy.   Physician Treatment Plan for Primary Diagnosis: <principal problem not specified> Long Term Goal(s): Improvement in symptoms so as ready for discharge Improvement in symptoms so as ready for discharge   Short Term Goals: Ability to identify changes in lifestyle to reduce recurrence of condition will improve Ability to verbalize feelings will improve Ability to disclose and discuss suicidal ideas Ability to demonstrate self-control will improve Ability to identify and develop effective coping behaviors will improve Ability to maintain clinical measurements within normal limits will improve Compliance with prescribed medications will improve Ability to identify  triggers associated with substance abuse/mental health issues will improve Ability to identify changes in lifestyle to reduce recurrence of condition will improve Ability to verbalize feelings will improve Ability to disclose and discuss suicidal ideas Ability to demonstrate self-control will improve Ability to identify and develop effective coping behaviors will improve Ability to maintain clinical measurements within normal limits will improve Compliance with  prescribed medications will improve Ability to identify triggers associated with substance abuse/mental health issues will improve  Medication Management: Evaluate patient's response, side effects, and tolerance of medication regimen.  Therapeutic Interventions: 1 to 1 sessions, Unit Group sessions and Medication administration.  Evaluation of Outcomes: Not Met  Physician Treatment Plan for Secondary Diagnosis: Active Problems:   MDD (major depressive disorder), recurrent episode, severe ([facility])  Long Term Goal(s): Improvement in symptoms so as ready for discharge Improvement in symptoms so as ready for discharge   Short Term Goals: Ability to identify changes in lifestyle to reduce recurrence of condition will improve Ability to verbalize feelings will improve Ability to disclose and discuss suicidal ideas Ability to demonstrate self-control will improve Ability to identify and develop effective coping behaviors will improve Ability to maintain clinical measurements within normal limits will improve Compliance with prescribed medications will improve Ability to identify triggers associated with substance abuse/mental health issues will improve Ability to identify changes in lifestyle to reduce recurrence of condition will improve Ability to verbalize feelings will improve Ability to disclose and discuss suicidal ideas Ability to demonstrate self-control will improve Ability to identify and develop effective coping behaviors will improve Ability to maintain clinical measurements within normal limits will improve Compliance with prescribed medications will improve Ability to identify triggers associated with substance abuse/mental health issues will improve     Medication Management: Evaluate patient's response, side effects, and tolerance of medication regimen.  Therapeutic Interventions: 1 to 1 sessions, Unit Group sessions and Medication administration.  Evaluation of Outcomes:  Not Met   RN Treatment Plan for Primary Diagnosis: <principal problem not specified> Long Term Goal(s): Knowledge of disease and therapeutic regimen to maintain health will improve  Short Term Goals: Ability to disclose and discuss suicidal ideas, Ability to identify and develop effective coping behaviors will improve and Compliance with prescribed medications will improve  Medication Management: RN will administer medications as ordered by provider, will assess and evaluate patient's response and provide education to patient for prescribed medication. RN will report any adverse and/or side effects to prescribing provider.  Therapeutic Interventions: 1 on 1 counseling sessions, Psychoeducation, Medication administration, Evaluate responses to treatment, Monitor vital signs and CBGs as ordered, Perform/monitor CIWA, COWS, AIMS and Fall Risk screenings as ordered, Perform wound care treatments as ordered.  Evaluation of Outcomes: Not Met   LCSW Treatment Plan for Primary Diagnosis: <principal problem not specified> Long Term Goal(s): Safe transition to appropriate next level of care at discharge, Engage patient in therapeutic group addressing interpersonal concerns.  Short Term Goals: Engage patient in aftercare planning with referrals and resources  Therapeutic Interventions: Assess for all discharge needs, 1 to 1 time with Social worker, Explore available resources and support systems, Assess for adequacy in community support network, Educate family and significant other(s) on suicide prevention, Complete Psychosocial Assessment, Interpersonal group therapy.  Evaluation of Outcomes: Not Met   Progress in Treatment: Attending groups: No. Participating in groups: No. Taking medication as prescribed: Yes. Toleration medication: Yes. Family/Significant other contact made: No, will contact:  the patient's mother Patient understands diagnosis: Yes. Limited insight  Discussing patient  identified problems/goals with staff: Yes. Medical problems stabilized or resolved: Yes. Denies suicidal/homicidal ideation: Yes. Issues/concerns per patient self-inventory: No. Other:   New problem(s) identified: None   New Short Term/Long Term Goal(s): Detox, medication stabilization, elimination of SI thoughts, development of comprehensive mental wellness plan.    Patient Goals: "Getting my health back right. If I get my health right and my mental health would be better"    Discharge Plan or Barriers: Patient plans to return home with her mother and family in [place], [place]. She expressed interest in outpatient medication management and therapy services. Patient recently admitted. CSW will continue to follow and assess for appropriate referrals and possible discharge planning.    Reason for Continuation of Hospitalization: Anxiety Depression Medical Issues Medication stabilization Suicidal ideation  Estimated Length of Stay: 3-5 days   Attendees: Patient: Paula Gonzalez 07/14/2019 9:05 AM  Physician: Dr. Myles Lipps, MD 07/14/2019 9:05 AM  Nursing:  07/14/2019 9:05 AM  RN Care Manager: 07/14/2019 9:05 AM  Social Worker: Radonna Ricker, LCSW 07/14/2019 9:05 AM  Recreational Therapist:  07/14/2019 9:05 AM  Other:  07/14/2019 9:05 AM  Other:  07/14/2019 9:05 AM  Other: 07/14/2019 9:05 AM    Scribe for Treatment Team: Marylee Floras, [place] 07/14/2019 9:05 AM

## 2019-07-14 NOTE — Progress Notes (Signed)
Recreation Therapy Notes  Date:  3.12.21 Time: 0930 Location: [address] Dayroom  Group Topic: Stress Management  Goal Area(s) Addresses:  Patient will identify positive stress management techniques. Patient will identify benefits of using stress management post d/c.  Behavioral Response: Engaged  Intervention: Stress Management  Activity :  Meditation.  LRT played a meditation that focused on being resilient in the face of adversity.  Patients were to listen as meditation played to engage in activity.  Education:  Stress Management, Discharge Planning.   Education Outcome: Acknowledges Education  Clinical Observations/Feedback: Pt attended and participated in activity.    Caroll Rancher, LRT/CTRS        Caroll Rancher A 07/14/2019 10:53 AM

## 2019-07-14 NOTE — Progress Notes (Addendum)
Pt became physically aggressive towards property and verbally aggressive towards staff after her immediate transfer from 300 hall to 500 hall.  Pt ripped out her mattress filling, smeared feces on the wall, urinated on the floor and tore trashed the milieu (hall & dayroom) with papers and note book in her possession "I'm not picking up shit, that's why y'all got some fucking house keeping staff bitch, leave me the fuck alone bitch". Escalated in her behavior and became physically aggressive towards staff when redirected to stay on unit for dinner due to her verbal aggression and hostility on the unit. Pt was escorted to quiet room and was not verbally redirectable at this time. Dr. Jeannine Kitten made aware of incident. New order received for manual hold, seclusion and for Benadryl 50 mg IM and Ativan 2 mg IM. Pt placed in manual hold at 1735 for medication administration, then seclusion at 1740 due to continued combativeness towards staff, aggressive banging on seclusion room door, attempting to leave the area, will not contract for safety and threatening to staff "my family coming up here for y'all bitch, I will call my family fools". 1:1 checks maintained for safety.

## 2019-07-14 NOTE — Progress Notes (Signed)
Patient slammed her hands down on nurses station counter. Pt reports hearing staff say her name and heard everything we were saying about her. This Clinical research associate was quietly giving report to Software engineer. Pt is very anxious and paranoid about staff.  Pt escalating and cursing as she stormed off yelling that she has to leave.

## 2019-07-14 NOTE — Progress Notes (Signed)
[facility] Group Notes:  (Nursing/MHT/Case Management/Adjunct)  Date:  07/13/2019  Time:  2030  Type of Therapy:  wrap up group  Participation Level:  Active  Participation Quality:  Appropriate, Attentive, Sharing and Supportive  Affect:  Anxious  Cognitive:  Appropriate  Insight:  Improving  Engagement in Group:  Supportive  Modes of Intervention:  Clarification, Education and Support   Summary of Progress/Problems: Positive thinking and positive change were discussed.   Marcille Buffy 07/14/2019, 2:39 AM

## 2019-07-14 NOTE — [facility] Group Notes (Signed)
Type of Therapy/Topic: Identifying Irrational Beliefs/Thoughts  Participation Level: Minimal  Description of Group: The purpose of this group is to assist patients in learning to identify irrational beliefs and thoughts that contribute to their negative emotions and experience positive emotions. Patients will be guided to discuss ways in which they have been effected by irrational thoughts and beliefs and how to transform those irrational beliefs into rational ones. Newly identified rational beliefs will be juxtaposed with experiences of positive emotions or situations, and patients will be challenged to use rational beliefs or thoughts to combat negative ones. Special emphasis will be placed on coping with irrational beliefs in conflict situations, and patients will process healthy conflict resolution skills.  Therapeutic Goals: 1. Patient will identify two irrational thoughts or beliefs  to reflect on in order to balance out those thoughts 2. Patient will label two or more irrational thoughts/beliefs that they find the most difficult to cope with 3. Patient will demonstrate positive conflict resolution skills through discussion and/or role plays that will assist in transforming irrational thoughts or beliefs into positive ones.  Summary of Patient Progress:  Psycho-education worksheet provided. CSW and patient's discussed worksheet and addressed any questions. Patient's kept worksheet as a reference for health coping skills. Patient was in group during the beginning of the group session, however she left group to speak with the doctor.    Therapeutic Modalities: Cognitive Behavioral Therapy Feelings Identification Dialectical Behavioral Therapy

## 2019-07-14 NOTE — Progress Notes (Signed)
   07/14/19 2200  Psych Admission Type (Psych Patients Only)  Admission Status Voluntary  Psychosocial Assessment  Patient Complaints Anxiety  Eye Contact Brief  Facial Expression Anxious;Sad  Affect Anxious;Flat  Speech Soft  Interaction Avoidant  Motor Activity Slow  Appearance/Hygiene In scrubs  Behavior Characteristics Cooperative  Mood Labile;Depressed  Aggressive Behavior  Effect No apparent injury  Thought Process  Coherency Circumstantial  Content Preoccupation  Delusions None reported or observed  Perception WDL  Hallucination None reported or observed  Judgment Impaired  Confusion None  Danger to Self  Current suicidal ideation? Denies  Self-Injurious Behavior No self-injurious ideation or behavior indicators observed or expressed   Agreement Not to Harm Self Yes  Description of Agreement verbally contracts for safety  Danger to Others  Danger to Others None reported or observed
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
## 2019-07-14 NOTE — Progress Notes (Signed)
Pt released from seclusion at 1930. Observed to be calm, cooperative, verbally redirectable and contracts for safety. Pt stated to writer "I'm sorry, I feel bad for what I did, I was just mad because the doctor moved me, I'm in this line of work too, I should have known better".  Escorted to her room and assisted with her shower and change of clothes. Ate her dinner and went to bed. Pt asleep at this time. Respirations noted and unlabored. Q 15 minutes safety checks maintained without self harm gestures or outburst at this time.

## 2019-07-14 NOTE — Progress Notes (Signed)
RN heard patient yelling and banging the door of the dayroom.  RN notified pt that the MD transferred pt to 500 hall.  Pt was upset and initially did not want to move to 506-2.   RN Hayes Ludwig deescalated the situation and escorted pt to her new room without incident.
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
## 2019-07-15 MED ORDER — DICYCLOMINE HCL 20 MG PO TABS
10.0000 mg | ORAL_TABLET | Freq: Three times a day (TID) | ORAL | Status: DC
  Filled 2019-07-15 (×8): qty 1

## 2019-07-15 MED ORDER — RISPERIDONE 1 MG/ML PO SOLN
1.0000 mg | Freq: Two times a day (BID) | ORAL | Status: DC
  Filled 2019-07-15 (×4): qty 1

## 2019-07-15 MED ORDER — ZIPRASIDONE MESYLATE 20 MG IM SOLR: 20.0000 mg | Freq: Four times a day (QID) | INTRAMUSCULAR | Status: DC | PRN

## 2019-07-15 MED ORDER — MIRTAZAPINE 15 MG PO TBDP
15.0000 mg | ORAL_TABLET | Freq: Every day | ORAL | Status: DC
  Administered 2019-07-15 – 2019-07-16 (×2): 15 mg via ORAL
  Filled 2019-07-15 (×5): qty 1

## 2019-07-15 MED ORDER — DICYCLOMINE HCL 10 MG PO CAPS
10.0000 mg | ORAL_CAPSULE | Freq: Three times a day (TID) | ORAL | Status: DC
  Administered 2019-07-15 – 2019-07-17 (×8): 10 mg via ORAL
  Filled 2019-07-15 (×16): qty 1

## 2019-07-15 MED ORDER — POLYETHYLENE GLYCOL 3350 17 G PO PACK
17.0000 g | PACK | Freq: Every day | ORAL | Status: DC
  Administered 2019-07-16 – 2019-07-17 (×2): 17 g via ORAL
  Filled 2019-07-15 (×4): qty 1

## 2019-07-15 MED ORDER — ADULT MULTIVITAMIN LIQUID CH
15.0000 mL | Freq: Every day | ORAL | Status: DC
  Filled 2019-07-15 (×2): qty 15

## 2019-07-15 NOTE — Progress Notes (Signed)
Pt observed lying down in bed. She closed her eyes when I walked into the room and refused to speak with me (nurse). She refuses to come to the nurses station to take her medications.   Per report, she was aggressive and paranoid yesterday (3/12/), which led to a STARR/restraint.

## 2019-07-15 NOTE — [facility] Group Notes (Signed)
[facility] Group Notes: (Clinical Social Work)   07/15/2019      Type of Therapy:  Group Therapy   Participation Level:  Did Not Attend - was invited individually by [facility] and chose not to attend.   Cyril Loosen, LCSWA 07/15/2019, 12:32 PM

## 2019-07-15 NOTE — Progress Notes (Signed)
Patient concerned with her constipation issues. She c/o not having an appetite for her reasons not eating. Writer encouraged her to drink some gatorade if not hungry and she reported that she would try and eat  Something. She was given a chef salad to eat. Writer will record intake and output in epic. Safety maintained with 15 min checks.

## 2019-07-15 NOTE — Progress Notes (Signed)
Pt states she did not urinate today because she was asleep.  No BM today. She consumed a total of 472 ml between 7a-7p.

## 2019-07-15 NOTE — Progress Notes (Signed)
[facility] MD Progress Note  07/15/2019 11:23 AM Paula Gonzalez  MRN:  [id number] Subjective: Patient is a 19 year old female admitted on 07/11/2019 secondary to depression and suicidal thoughts.  Objective: Patient is seen and examined.  Patient is a 19 year old female with the above-stated past psychiatric history is seen in follow-up.  Unfortunately yesterday she kept on repeating to leave banging on the door to the doctor's office, and demanding that she received attention and is well talk to her mother.  She does not believe there is anything wrong with her psychiatrically, and she continues to be concerned about her abdominal pain.  Initially it was thought that this was probably depression with anxiety combined with marijuana use, and that she would be appropriate on the [employer].  She had had previous admission with substance-induced mood disorders and dependencies, and her drug screen on admission was only positive for benzos.  After that episode we decided to move her to the 500 hall because she had complained of auditory hallucinations and some paranoid delusions.  She thought people were talking about her, laughing at her.  The day prior to this she had been restarted on Seroquel which she had received on her last psychiatric hospitalization.  When she was moved to the 500 hall she became significantly agitated and required seclusion.  She required intramuscular medication.  I did speak to her mother, and told her that her MRI was essentially negative and did not explain any of the abdominal pain.  I have concern that part of her abdominal pain may be related either to PTSD her somatic delusions.  She had been previously started on Metamucil as well as MiraLAX for this.  Unfortunately overnight she has begun to refuse medications, and she is lying on the floor in her room currently.  She refuses to speak to anyone.  Her vital signs are stable.  Her blood pressure is 118/86, pulse is 84, and she is afebrile.   After she received intramuscular medication last night she slept 9.75 hours.  Reviewing her MRI again revealed triangular area of typical fat noted near the falciform ligament.  This accounted for the ultrasound finding seen earlier.  There was an 8 mm segment 7 hepatic lesion is consistent with a benign flash filling hemangioma.  There was a moderate to large amount of stool noted in the colon.  It was suggestive of constipation.  Her main focus of heartburn the hospitalization is her abdominal pain.  Her last laboratories were from 07/11/2019 that showed essentially normal electrolytes, normal CBC except for an elevated platelet count of 493,000.  Her current medications include Bentyl, Colace, Remeron, Zyprexa as needed, Zofran, Protonix, MiraLAX, Metamucil and Seroquel.  Principal Problem: Substance induced mood disorder ([facility]) Diagnosis: Principal Problem:   Substance induced mood disorder ([facility]) Active Problems:   Marijuana dependence ([facility])   Paranoia (psychosis) ([facility])  Total Time spent with patient: 15 minutes  Past Psychiatric History: See admission H&P  Past Medical History: History reviewed. No pertinent past medical history. History reviewed. No pertinent surgical history. Family History:  Family History  Family history unknown: Yes   Family Psychiatric  History: See admission H&P Social History:  Social History   Substance and Sexual Activity  Alcohol Use Yes   Comment: drink socially     Social History   Substance and Sexual Activity  Drug Use Yes  . Types: Marijuana    Social History   Socioeconomic History  . Marital status: Single    Spouse  name: Not on file  . Number of children: Not on file  . Years of education: Not on file  . Highest education level: Not on file  Occupational History  . Not on file  Tobacco Use  . Smoking status: Current Every Day Smoker    Packs/day: 0.50    Types: Cigarettes  . Smokeless tobacco: Never Used  Substance and Sexual  Activity  . Alcohol use: Yes    Comment: drink socially  . Drug use: Yes    Types: Marijuana  . Sexual activity: Not on file  Other Topics Concern  . Not on file  Social History Narrative  . Not on file   Social Determinants of Health   Financial Resource Strain:   . Difficulty of Paying Living Expenses:   Food Insecurity:   . Worried About Programme researcher, broadcasting/film/video in the Last Year:   . Barista in the Last Year:   Transportation Needs:   . Freight forwarder (Medical):   Marland Kitchen Lack of Transportation (Non-Medical):   Physical Activity:   . Days of Exercise per Week:   . Minutes of Exercise per Session:   Stress:   . Feeling of Stress :   Social Connections:   . Frequency of Communication with Friends and Family:   . Frequency of Social Gatherings with Friends and Family:   . Attends Religious Services:   . Active Member of Clubs or Organizations:   . Attends Banker Meetings:   Marland Kitchen Marital Status:    Additional Social History:                         Sleep: Good  Appetite:  Fair  Current Medications: Current Facility-Administered Medications  Medication Dose Route Frequency Provider Last Rate Last Admin  . acetaminophen (TYLENOL) tablet 650 mg  650 mg Oral Q6H PRN Anike, Adaku C, NP   650 mg at 07/13/19 0744  . dicyclomine (BENTYL) tablet 20 mg  20 mg Oral TID AC & HS Jola Babinski Marlane Mingle, MD      . docusate sodium (COLACE) capsule 100 mg  100 mg Oral Daily Patrcia Dolly, FNP   100 mg at 07/14/19 0935  . feeding supplement (BOOST / RESOURCE BREEZE) liquid 1 Container  1 Container Oral BID BM Antonieta Pert, MD   1 Container at 07/14/19 1057  . hydrOXYzine (ATARAX/VISTARIL) tablet 25 mg  25 mg Oral Q6H PRN Patrcia Dolly, FNP   25 mg at 07/12/19 2200  . LORazepam (ATIVAN) injection 2 mg  2 mg Intramuscular Q6H PRN Malvin Johns, MD   2 mg at 07/14/19 1735  . OLANZapine zydis (ZYPREXA) disintegrating tablet 10 mg  10 mg Oral Q8H PRN Antonieta Pert, MD       And  . LORazepam (ATIVAN) tablet 1 mg  1 mg Oral PRN Antonieta Pert, MD      . mirtazapine (REMERON) tablet 30 mg  30 mg Oral QHS Antonieta Pert, MD      . multivitamin with minerals tablet 1 tablet  1 tablet Oral Daily Antonieta Pert, MD      . ondansetron (ZOFRAN-ODT) disintegrating tablet 4 mg  4 mg Oral Q8H PRN Anike, Adaku C, NP   4 mg at 07/12/19 2125  . pantoprazole (PROTONIX) EC tablet 40 mg  40 mg Oral Daily Jakeia Carreras, Marlane Mingle, MD      . polyethylene glycol [facility] /  GLYCOLAX) packet 17 g  17 g Oral Daily PRN Emmaline Kluver, FNP   17 g at 07/13/19 0747  . psyllium (HYDROCIL/METAMUCIL) packet 1 packet  1 packet Oral Daily Sharma Covert, MD   1 packet at 07/14/19 0935  . QUEtiapine (SEROQUEL) tablet 50 mg  50 mg Oral TID Johnn Hai, MD      . senna-docusate (Senokot-S) tablet 2 tablet  2 tablet Oral BID Johnn Hai, MD        Lab Results: No results found for this or any previous visit (from the past 48 hour(s)).  Blood Alcohol level:  Lab Results  Component Value Date   ETH <10 07/11/2019   ETH <10 17/61/6073    Metabolic Disorder Labs: Lab Results  Component Value Date   HGBA1C 5.3 12/22/2017   MPG 105.41 12/22/2017   No results found for: PROLACTIN Lab Results  Component Value Date   CHOL 157 12/22/2017   TRIG 134 12/22/2017   HDL 48 12/22/2017   CHOLHDL 3.3 12/22/2017   VLDL 27 12/22/2017   LDLCALC 82 12/22/2017    Physical Findings: AIMS: Facial and Oral Movements Muscles of Facial Expression: None, normal Lips and Perioral Area: None, normal Jaw: None, normal Tongue: None, normal,Extremity Movements Upper (arms, wrists, hands, fingers): None, normal Lower (legs, knees, ankles, toes): None, normal, Trunk Movements Neck, shoulders, hips: None, normal, Overall Severity Severity of abnormal movements (highest score from questions above): None, normal Incapacitation due to abnormal movements: None, normal Patient's  awareness of abnormal movements (rate only patient's report): No Awareness, Dental Status Current problems with teeth and/or dentures?: No Does patient usually wear dentures?: No  CIWA:    COWS:     Musculoskeletal: Strength & Muscle Tone: within normal limits Gait & Station: Currently lying on the floor Patient leans: N/A  Psychiatric Specialty Exam: Physical Exam  Nursing note and vitals reviewed. Constitutional: She appears well-developed and well-nourished.  HENT:  Head: Normocephalic and atraumatic.  Respiratory: Effort normal.  Neurological: She is alert.    Review of Systems  Blood pressure 118/86, pulse 84, temperature 98.5 F (36.9 C), temperature source Oral, resp. rate 18, height 5\' 5"  (1.651 m), weight 48.5 kg, last menstrual period 06/17/2019, SpO2 100 %.Body mass index is 17.81 kg/m.  General Appearance: Disheveled  Eye Contact:  Minimal  Speech:  Refuses to speak  Volume:  Refuses to speak  Mood:  Dysphoric and Irritable  Affect:  Congruent  Thought Process:  Goal Directed and Descriptions of Associations: Circumstantial  Orientation:  Negative  Thought Content:  Illogical and Hallucinations: Auditory  Suicidal Thoughts:  No  Homicidal Thoughts:  No  Memory:  Immediate;   Poor Recent;   Poor Remote;   Poor  Judgement:  Impaired  Insight:  Lacking  Psychomotor Activity:  Decreased  Concentration:  Concentration: Poor and Attention Span: Poor  Recall:  Poor  Fund of Knowledge:  Poor  Language:  Poor  Akathisia:  Negative  Handed:  Right  AIMS (if indicated):     Assets:  Desire for Improvement Housing Resilience  ADL's:  Impaired  Cognition:  WNL  Sleep:  Number of Hours: 9.75     Treatment Plan Summary: Daily contact with patient to assess and evaluate symptoms and progress in treatment, Medication management and Plan : Patient is seen and examined.  Patient is a 19 year old female with the above-stated past psychiatric history who is seen in  follow-up.   Diagnosis: #1 unspecified psychosis, #2 possible somatic  delusions, #3 history of polysubstance use disorders, #4 constipation, #5 unspecified abdominal pain, #6 history of PTSD  Patient is seen in follow-up.  She is not willing to speak and had agitation significantly yesterday.  She has not taken the Seroquel yesterday and had to have Geodon ordered.  I want to switch her to a clear liquid diet to try and assist in getting her bowel movement given her poor p.o. intake at this point.  I do not want her to have worsening abdominal pain from that.  She is not taking the Metamucil or MiraLAX.  Stop the Seroquel.  I am going to change her to Risperdal liquid.  Hopefully she will take that.  I will change her Remeron to the dissolvable tablet as well.  I am going to decrease her Bentyl back down to 10 mg 3 times daily and AC at bedtime.  No other changes right now.  Hopefully this will get her a little bit more flexible.  1.  Decrease Bentyl to 10 mg p.o. 3 times daily before meals and at bedtime. 2.  Continue Colace 100 mg p.o. daily for constipation. 3.  Continue boost supplemental diet. 4.  Continue hydroxyzine 25 mg p.o. every 6 hours as needed anxiety. 5.  Continue lorazepam 2 mg IM every 6 hours as needed severe agitation. 6.  Change mirtazapine to 30 mg p.o. nightly dissolvable tablets for anxiety and depression. 7.  Continue agitation protocol with olanzapine, lorazepam and ziprasidone. 8.  Continue Zofran dissolvable tablets 4 mg p.o. every 8 hours as needed nausea and vomiting. 9.  Continue Protonix 40 mg p.o. daily for gastric protection. 10.  Change MiraLAX 17 g in 8 ounces of water to standing dosage daily for constipation. 11.  Continue Metamucil 1 packet p.o. daily for constipation. 12.  Stop Seroquel. 13.  Risperdal solution 1 mg p.o. 3 times daily.  This is for psychosis. 14.  Continue Senokot 2 tablets p.o. twice daily for constipation. 15.  Changed to a clear liquid  diet. 16.  Check electrolytes tomorrow morning. 17.  Disposition planning-in progress  Antonieta Pert, MD 07/15/2019, 11:23 AM

## 2019-07-15 NOTE — Progress Notes (Signed)
   07/15/19 2120  COVID-19 Daily Checkoff  Have you had a fever (temp > 37.80C/100F)  in the past 24 hours?  No  If you have had runny nose, nasal congestion, sneezing in the past 24 hours, has it worsened? No  COVID-19 EXPOSURE  Have you traveled outside the state in the past 14 days? No  Have you been in contact with someone with a confirmed diagnosis of COVID-19 or PUI in the past 14 days without wearing appropriate PPE? No  Have you been living in the same home as a person with confirmed diagnosis of COVID-19 or a PUI (household contact)? No  Have you been diagnosed with COVID-19? No

## 2019-07-15 NOTE — Progress Notes (Addendum)
Pt remains irritable this evening. She continues to refuse medications and stated she is not going to eat while she's here. Pt encouraged to take medications. She will be provided a meal tray at supper time along with fluids. She has bottles of water in her room and has consumed 236 ml.

## 2019-07-16 MED ORDER — RISPERIDONE 1 MG PO TBDP
3.0000 mg | ORAL_TABLET | Freq: Every day | ORAL | Status: DC
  Administered 2019-07-16: 3 mg via ORAL
  Filled 2019-07-16 (×3): qty 3

## 2019-07-16 MED ORDER — RISPERIDONE 2 MG PO TBDP
2.0000 mg | ORAL_TABLET | Freq: Two times a day (BID) | ORAL | Status: DC
  Filled 2019-07-16 (×5): qty 1

## 2019-07-16 MED ORDER — ADULT MULTIVITAMIN W/MINERALS CH
1.0000 | ORAL_TABLET | Freq: Every day | ORAL | Status: DC
  Administered 2019-07-16 – 2019-07-17 (×2): 1 via ORAL
  Filled 2019-07-16 (×4): qty 1

## 2019-07-16 MED ORDER — RISPERIDONE 2 MG PO TBDP
2.0000 mg | ORAL_TABLET | Freq: Every day | ORAL | Status: DC
  Administered 2019-07-17: 2 mg via ORAL
  Filled 2019-07-16 (×3): qty 1

## 2019-07-16 MED ORDER — RISPERIDONE 1 MG/ML PO SOLN
2.0000 mg | Freq: Two times a day (BID) | ORAL | Status: DC
  Administered 2019-07-16: 2 mg via ORAL
  Filled 2019-07-16 (×3): qty 2

## 2019-07-16 NOTE — Progress Notes (Signed)
   07/16/19 2057  COVID-19 Daily Checkoff  Have you had a fever (temp > 37.80C/100F)  in the past 24 hours?  No  If you have had runny nose, nasal congestion, sneezing in the past 24 hours, has it worsened? No  COVID-19 EXPOSURE  Have you traveled outside the state in the past 14 days? No  Have you been in contact with someone with a confirmed diagnosis of COVID-19 or PUI in the past 14 days without wearing appropriate PPE? No  Have you been living in the same home as a person with confirmed diagnosis of COVID-19 or a PUI (household contact)? No  Have you been diagnosed with COVID-19? No

## 2019-07-16 NOTE — Progress Notes (Signed)
   07/16/19 0900  Psych Admission Type (Psych Patients Only)  Admission Status Voluntary  Psychosocial Assessment  Patient Complaints None  Eye Contact Brief  Facial Expression Animated  Affect Appropriate to circumstance  Speech Logical/coherent  Interaction Minimal  Motor Activity Slow  Appearance/Hygiene Unremarkable  Behavior Characteristics Appropriate to situation  Mood Preoccupied  Aggressive Behavior  Effect No apparent injury  Thought Process  Coherency WDL  Content WDL  Delusions None reported or observed  Perception WDL  Hallucination None reported or observed  Judgment WDL  Confusion None  Danger to Self  Current suicidal ideation? Denies  Self-Injurious Behavior No self-injurious ideation or behavior indicators observed or expressed   Danger to Others  Danger to Others None reported or observed

## 2019-07-16 NOTE — Progress Notes (Signed)
[facility] MD Progress Note  07/16/2019 11:39 AM Paula Gonzalez  MRN:  [id number] Subjective:  Patient is a 19 year old female admitted on 07/11/2019 secondary to depression and suicidal thoughts.  Objective: Patient is seen and examined.  Patient is a 19 year old female with the above-stated past psychiatric history who is seen in follow-up.  She is actually better this morning.  She still does not say a great deal to me, but she has been compliant with her medicines.  She admitted to me that she had a bowel movement this morning, the nurses stated that she had 2 small bowel movements.  We have told her that the risk for doll would help her abdominal pain, and she has been compliant with medications.  She is not so focused on her abdominal pain, and she still remains somewhat paranoid.  It is hard to figure out whether or not she is still actively psychotic because she will not discuss anything with regard to people "left to get her talking about her".  She did request that her medications be changed from liquid to pill form.  Her vital signs are stable, she is afebrile.  Her sleep is still not great.  She only got 4.75 hours last night.  Principal Problem: Substance induced mood disorder (HCC) Diagnosis: Principal Problem:   Substance induced mood disorder (HCC) Active Problems:   Marijuana dependence (HCC)   Paranoia (psychosis) (HCC)  Total Time spent with patient: 15 minutes  Past Psychiatric History: See admission H&P  Past Medical History: History reviewed. No pertinent past medical history. History reviewed. No pertinent surgical history. Family History:  Family History  Family history unknown: Yes   Family Psychiatric  History: See admission H&P Social History:  Social History   Substance and Sexual Activity  Alcohol Use Yes   Comment: drink socially     Social History   Substance and Sexual Activity  Drug Use Yes  . Types: Marijuana    Social History   Socioeconomic History  .  Marital status: Single    Spouse name: Not on file  . Number of children: Not on file  . Years of education: Not on file  . Highest education level: Not on file  Occupational History  . Not on file  Tobacco Use  . Smoking status: Current Every Day Smoker    Packs/day: 0.50    Types: Cigarettes  . Smokeless tobacco: Never Used  Substance and Sexual Activity  . Alcohol use: Yes    Comment: drink socially  . Drug use: Yes    Types: Marijuana  . Sexual activity: Not on file  Other Topics Concern  . Not on file  Social History Narrative  . Not on file   Social Determinants of Health   Financial Resource Strain:   . Difficulty of Paying Living Expenses:   Food Insecurity:   . Worried About Programme researcher, broadcasting/film/video in the Last Year:   . Barista in the Last Year:   Transportation Needs:   . Freight forwarder (Medical):   Marland Kitchen Lack of Transportation (Non-Medical):   Physical Activity:   . Days of Exercise per Week:   . Minutes of Exercise per Session:   Stress:   . Feeling of Stress :   Social Connections:   . Frequency of Communication with Friends and Family:   . Frequency of Social Gatherings with Friends and Family:   . Attends Religious Services:   . Active Member of Clubs or Organizations:   .  Attends Archivist Meetings:   Marland Kitchen Marital Status:    Additional Social History:                         Sleep: Fair  Appetite:  Poor  Current Medications: Current Facility-Administered Medications  Medication Dose Route Frequency Provider Last Rate Last Admin  . acetaminophen (TYLENOL) tablet 650 mg  650 mg Oral Q6H PRN Anike, Adaku C, NP   650 mg at 07/13/19 0744  . dicyclomine (BENTYL) capsule 10 mg  10 mg Oral TID AC & HS Sharma Covert, MD   10 mg at 07/16/19 0815  . docusate sodium (COLACE) capsule 100 mg  100 mg Oral Daily Emmaline Kluver, FNP   100 mg at 07/16/19 0815  . feeding supplement (BOOST / RESOURCE BREEZE) liquid 1 Container  1  Container Oral BID BM Sharma Covert, MD   1 Container at 07/16/19 1008  . hydrOXYzine (ATARAX/VISTARIL) tablet 25 mg  25 mg Oral Q6H PRN Emmaline Kluver, FNP   25 mg at 07/12/19 2200  . LORazepam (ATIVAN) injection 2 mg  2 mg Intramuscular Q6H PRN Johnn Hai, MD   2 mg at 07/14/19 1735  . OLANZapine zydis (ZYPREXA) disintegrating tablet 10 mg  10 mg Oral Q8H PRN Sharma Covert, MD       And  . LORazepam (ATIVAN) tablet 1 mg  1 mg Oral PRN Sharma Covert, MD      . mirtazapine (REMERON SOL-TAB) disintegrating tablet 15 mg  15 mg Oral QHS Sharma Covert, MD   15 mg at 07/15/19 2119  . multivitamin with minerals tablet 1 tablet  1 tablet Oral Daily Sharma Covert, MD      . ondansetron (ZOFRAN-ODT) disintegrating tablet 4 mg  4 mg Oral Q8H PRN Anike, Adaku C, NP   4 mg at 07/12/19 2125  . pantoprazole (PROTONIX) EC tablet 40 mg  40 mg Oral Daily Sharma Covert, MD   40 mg at 07/16/19 0815  . polyethylene glycol (MIRALAX / GLYCOLAX) packet 17 g  17 g Oral Daily Sharma Covert, MD   17 g at 07/16/19 0700  . psyllium (HYDROCIL/METAMUCIL) packet 1 packet  1 packet Oral Daily Sharma Covert, MD   1 packet at 07/16/19 [phone]  . risperiDONE (RISPERDAL M-TABS) disintegrating tablet 2 mg  2 mg Oral BID Sharma Covert, MD      . senna-docusate (Senokot-S) tablet 2 tablet  2 tablet Oral BID Johnn Hai, MD   2 tablet at 07/16/19 0815  . ziprasidone (GEODON) injection 20 mg  20 mg Intramuscular Q6H PRN Sharma Covert, MD        Lab Results: No results found for this or any previous visit (from the past 48 hour(s)).  Blood Alcohol level:  Lab Results  Component Value Date   ETH <10 07/11/2019   ETH <10 56/21/3086    Metabolic Disorder Labs: Lab Results  Component Value Date   HGBA1C 5.3 12/22/2017   MPG 105.41 12/22/2017   No results found for: PROLACTIN Lab Results  Component Value Date   CHOL 157 12/22/2017   TRIG 134 12/22/2017   HDL 48 12/22/2017    CHOLHDL 3.3 12/22/2017   VLDL 27 12/22/2017   LDLCALC 82 12/22/2017    Physical Findings: AIMS: Facial and Oral Movements Muscles of Facial Expression: None, normal Lips and Perioral Area: None, normal Jaw: None, normal Tongue: None,  normal,Extremity Movements Upper (arms, wrists, hands, fingers): None, normal Lower (legs, knees, ankles, toes): None, normal, Trunk Movements Neck, shoulders, hips: None, normal, Overall Severity Severity of abnormal movements (highest score from questions above): None, normal Incapacitation due to abnormal movements: None, normal Patient's awareness of abnormal movements (rate only patient's report): No Awareness, Dental Status Current problems with teeth and/or dentures?: No Does patient usually wear dentures?: No  CIWA:    COWS:     Musculoskeletal: Strength & Muscle Tone: within normal limits Gait & Station: normal Patient leans: N/A  Psychiatric Specialty Exam: Physical Exam  Nursing note and vitals reviewed. Constitutional: She appears well-developed and well-nourished.  HENT:  Head: Normocephalic and atraumatic.  Respiratory: Effort normal.  Neurological: She is alert.    Review of Systems  Blood pressure 108/85, pulse 64, temperature 97.8 F (36.6 C), temperature source Oral, resp. rate 18, height 5\' 5"  (1.651 m), weight 48.5 kg, last menstrual period 06/17/2019, SpO2 100 %.Body mass index is 17.81 kg/m.  General Appearance: Casual  Eye Contact:  Fair  Speech:  Normal Rate  Volume:  Normal  Mood:  Dysphoric and Irritable  Affect:  Congruent  Thought Process:  Goal Directed and Descriptions of Associations: Circumstantial  Orientation:  Negative  Thought Content:  Delusions, Hallucinations: Auditory Somatic, Obsessions, Paranoid Ideation and Rumination  Suicidal Thoughts:  No  Homicidal Thoughts:  No  Memory:  Immediate;   Poor Recent;   Poor Remote;   Poor  Judgement:  Impaired  Insight:  Lacking  Psychomotor Activity:   Normal  Concentration:  Concentration: Fair and Attention Span: Fair  Recall:  06/19/2019 of Knowledge:  Fair  Language:  Good  Akathisia:  Negative  Handed:  Right  AIMS (if indicated):     Assets:  Desire for Improvement Resilience  ADL's:  Intact  Cognition:  WNL  Sleep:  Number of Hours: 4.75     Treatment Plan Summary: Daily contact with patient to assess and evaluate symptoms and progress in treatment, Medication management and Plan : Patient is seen and examined.  Patient is a 19 year old female with the above-stated past psychiatric history who is seen in follow-up.  Diagnosis: #1 unspecified psychosis, #2 possible somatic delusions, #3 history of polysubstance use disorders, #4 constipation, #5 unspecified abdominal pain, #6 history of PTSD  Patient is seen in follow-up.  She is doing better today.  She is still angry, but at least is compliant with medication and her mood seems to be improving as well as her agitation towards others.  She did request to have the liquids changed to pills, and I will take care of that today.  No change with her current medications right now.  If she begins to want solid food, we will switch her to a regular diet, but we will discontinue to monitor that for now.  She refused her laboratories this morning, and we will try and reorder them tomorrow with regard to her metabolic status.  1.  Continue Bentyl 10 mg p.o. 3 times daily before meals and at bedtime for functional abdominal pain. 2.  Continue Colace 100 mg p.o. daily for constipation. 3.  Continue hydroxyzine 25 mg p.o. every 6 hours as needed anxiety. 4.  Continue lorazepam 2 mg IM every 6 hours as needed agitation. 5.  Continue mirtazapine 15 mg p.o. nightly for mood and anxiety. 6.  Continue agitation protocol if necessary. 7.  Continue Zofran ODT disintegrating tablet 4 mg every 8 hours as needed nausea.  8.  Continue Protonix 40 mg p.o. daily for gastric protection. 9.  Continue  MiraLAX 17 g in 8 ounces of water p.o. daily for constipation. 10.  Continue Metamucil 1 packet p.o. daily for functional GI disorder. 11.  Change Risperdal to disintegrating tablets 2 mg p.o. daily and 3 mg p.o. nightly for psychosis. 12.  Continue Senokot 2 tablets p.o. twice daily for constipation. 13.  Continue Geodon 20 mg IM every 6 hours as needed agitation. 14.  Electrolyte panel in a.m. tomorrow. 15.  Disposition planning-in progress. Antonieta Pert, MD 07/16/2019, 11:39 AM

## 2019-07-16 NOTE — Progress Notes (Signed)
Pt signed a 72 hour request for discharge form on 07/16/19 at 08:15 am.

## 2019-07-16 NOTE — [facility] Group Notes (Signed)
[facility] LCSW Group Therapy Note  Date/Time:  07/16/2019 11:15-12:00 PM  Type of Therapy and Topic:  Group Therapy:  Healthy and Unhealthy Supports  Participation Level:  Minimal   Description of Group:  Patients in this group were introduced to the idea of adding a variety of healthy supports to address the various needs in their lives.Patients discussed what additional healthy supports could be helpful in their recovery and wellness after discharge in order to prevent future hospitalizations.   An emphasis was placed on using counselor, doctor, therapy groups, 12-step groups, and problem-specific support groups to expand supports.  They also worked as a group on developing a specific plan for several patients to deal with unhealthy supports through boundary-setting, psychoeducation with loved ones, and even termination of relationships.   Therapeutic Goals:   1)  discuss importance of adding supports to stay well once out of the hospital  2)  compare healthy versus unhealthy supports and identify some examples of each  3)  generate ideas and descriptions of healthy supports that can be added  4)  offer mutual support about how to address unhealthy supports  5)  encourage active participation in and adherence to discharge plan    Summary of Patient Progress:  The patient willingly participated in introductory check-in, sharing of being ready to go home, but generally feeling good. Pt stated that current healthy supports in her life are herslef as she is the only one that supports herself, while current unhealthy supports include everyone else such as family and friends as they are often times only interested in what is going on with someone in order for them to gossip and talk about them.  The patient did not express a willingness to add anyone as support(s) to help in her recovery journey. Pt left group prior to closing discussions with no return.   Therapeutic Modalities:    Motivational Interviewing Brief Solution-Focused Therapy  Leisa Lenz, MSW, [facility] 07/16/2019 12:50P

## 2019-07-16 NOTE — Progress Notes (Addendum)
Pt compliant with taking morning medications. She reports drinking plenty of fluids but continues to not have an appetite. She has not had a recent BM. She is pleasant on approach and compliant with treatment plan.   Pt now reports she's had 2 soft BM.

## 2019-07-17 MED ORDER — FLUOXETINE HCL 20 MG PO CAPS: 20.0000 mg | ORAL_CAPSULE | Freq: Every day | ORAL | 0 refills | Status: AC

## 2019-07-17 MED ORDER — CARIPRAZINE HCL 3 MG PO CAPS: 3.0000 mg | ORAL_CAPSULE | Freq: Every day | ORAL | 1 refills | Status: AC

## 2019-07-17 MED ORDER — PSYLLIUM 95 % PO PACK: 1.0000 | PACK | Freq: Two times a day (BID) | ORAL | 3 refills | Status: AC

## 2019-07-17 MED ORDER — DOCUSATE SODIUM 100 MG PO CAPS: 100.0000 mg | ORAL_CAPSULE | Freq: Two times a day (BID) | ORAL | 2 refills | Status: AC

## 2019-07-17 MED ORDER — POLYETHYLENE GLYCOL 3350 17 G PO PACK: 17.0000 g | PACK | Freq: Every day | ORAL | 0 refills | Status: AC

## 2019-07-17 MED ORDER — PANTOPRAZOLE SODIUM 40 MG PO TBEC: 40.0000 mg | DELAYED_RELEASE_TABLET | Freq: Every day | ORAL | 2 refills | Status: AC

## 2019-07-17 MED ORDER — QUETIAPINE FUMARATE 50 MG PO TABS: 250.0000 mg | ORAL_TABLET | Freq: Every day | ORAL | 2 refills | Status: AC

## 2019-07-17 NOTE — [facility] Suicide Risk Assessment (Signed)
[facility] INPATIENT:  Family/Significant Other Suicide Prevention Education  Suicide Prevention Education:  Contact Attempts:  mother, Lorenda Hatchet [phone]) has been identified by the patient as the family member/significant other with whom the patient will be residing, and identified as the person(s) who will aid the patient in the event of a mental health crisis.  With written consent from the patient, two attempts were made to provide suicide prevention education, prior to and/or following the patient's discharge.  We were unsuccessful in providing suicide prevention education.  A suicide education pamphlet was given to the patient to share with family/significant other.  Date and time of first attempt: 07/17/2019 at 9:08am.  Date and time of second attempt: 07/17/2019 at 9:17am.  SPE pamphlet placed on chart for patient to share with supports.  Darreld Mclean 07/17/2019, 9:17 AM

## 2019-07-17 NOTE — [facility] Suicide Risk Assessment (Signed)
[facility] INPATIENT:  Family/Significant Other Suicide Prevention Education  Suicide Prevention Education:  Education Completed; mother, Lorenda Hatchet [phone]) has been identified by the patient as the family member/significant other with whom the patient will be residing, and identified as the person(s) who will aid the patient in the event of a mental health crisis (suicidal ideations/suicide attempt).  With written consent from the patient, the family member/significant other has been provided the following suicide prevention education, prior to the and/or following the discharge of the patient.  The suicide prevention education provided includes the following:  Suicide risk factors  Suicide prevention and interventions  National Suicide Hotline telephone number  [facility] assessment telephone number  [facility] Emergency Assistance 911  [facility] and/or Residential Mobile Crisis Unit telephone number  Request made of family/significant other to:  Remove weapons (e.g., guns, rifles, knives), all items previously/currently identified as safety concern.    Remove drugs/medications (over-the-counter, prescriptions, illicit drugs), all items previously/currently identified as a safety concern.  The family member/significant other verbalizes understanding of the suicide prevention education information provided.  The family member/significant other agrees to remove the items of safety concern listed above.  Paula Gonzalez 07/17/2019, 9:51 AM
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
## 2019-07-17 NOTE — [facility] Suicide Risk Assessment (Signed)
[facility] INPATIENT:  Family/Significant Other Suicide Prevention Education  Suicide Prevention Education:  Contact Attempts:  mother, Lorenda Hatchet [phone]) has been identified by the patient as the family member/significant other with whom the patient will be residing, and identified as the person(s) who will aid the patient in the event of a mental health crisis.  With written consent from the patient, two attempts were made to provide suicide prevention education, prior to and/or following the patient's discharge.  We were unsuccessful in providing suicide prevention education.  A suicide education pamphlet was given to the patient to share with family/significant other.  Date and time of first attempt: 07/17/2019 at 9:08am.  Date and time of second attempt: needs to be attempted  Paula Gonzalez 07/17/2019, 9:09 AM

## 2019-07-17 NOTE — Progress Notes (Signed)
Recreation Therapy Notes  Date: 3.15.21 Time: 1000 Location: 500 Hall Dayroom  Group Topic: Coping Skills  Goal Area(s) Addresses:  Patient will identify positive coping skills. Patient will identify benefit of using coping skills post d/c.  Behavioral Response: Engaged  Intervention: Mind Map  Activity: Mind Map.  Patients and LRT identified areas (anger, anxiety, depression, decision making, life changes, name calling, doubt and mood swings) in which coping skills would be needed. Patients were to then identify at least 3 coping skills for each area.  Education: Pharmacologist, Building control surveyor.   Education Outcome: Acknowledges understanding/In group clarification offered/Needs additional education.   Clinical Observations/Feedback: Pt was active, social and engaged in group session.  Pt identified some coping skills as remove self from situation; find a cool area; talk to someone, pray, take medicine; take time to think, weigh options; exercise; express feelings, let them know what you want to be referred to; and meditation, distance yourself.    Caroll Rancher, LRT/CTRS     Caroll Rancher A 07/17/2019 12:23 PM

## 2019-07-17 NOTE — [facility] Suicide Risk Assessment (Signed)
[facility] Discharge Suicide Risk Assessment   Principal Problem: Substance induced mood disorder (HCC) Discharge Diagnoses: Principal Problem:   Substance induced mood disorder (HCC) Active Problems:   Marijuana dependence (HCC)   Paranoia (psychosis) (HCC)   Total Time spent with patient: 45 minutes  Musculoskeletal: Strength & Muscle Tone: within normal limits Gait & Station: normal Patient leans: N/A  Psychiatric Specialty Exam: Review of Systems  Blood pressure 106/89, pulse 62, temperature 98 F (36.7 C), temperature source Oral, resp. rate 18, height 5\' 5"  (1.651 m), weight 50.8 kg, last menstrual period 06/17/2019, SpO2 100 %.Body mass index is 18.64 kg/m.  General Appearance: Casual  Eye Contact::  Good  Speech:  Clear and Coherent409  Volume:  Normal  Mood:  Euthymic  Affect:  Appropriate and Constricted  Thought Process:  Coherent and Linear  Orientation:  Full (Time, Place, and Person)  Thought Content:  Logical  Suicidal Thoughts:  No  Homicidal Thoughts:  No  Memory:  Immediate;   Good Recent;   Good  Judgement:  Intact  Insight:  Fair  Psychomotor Activity:  Normal  Concentration:  Good  Recall:  Good  Fund of Knowledge:Good  Language: Good  Akathisia:  Negative  Handed:  Right  AIMS (if indicated):     Assets:  Communication Skills Desire for Improvement  Sleep:  Number of Hours: 4.75  Cognition: WNL  ADL's:  Intact   Mental Status Per Nursing Assessment::   On Admission:  Suicidal ideation indicated by patient  Demographic Factors:  Unemployed  Loss Factors: Decrease in vocational status  Historical Factors: Impulsivity  Risk Reduction Factors:   Sense of responsibility to family and Religious beliefs about death  Continued Clinical Symptoms:  Medical Diagnoses and Treatments/Surgeries  Cognitive Features That Contribute To Risk:  Closed-mindedness    Suicide Risk:  Minimal: No identifiable suicidal ideation.  Patients presenting  with no risk factors but with morbid ruminations; may be classified as minimal risk based on the severity of the depressive symptoms  Follow-up Information    [facility] Follow up.   Why: Needs appts for therapy and med mgmt Contact information: [phone] [place], [place] Hours: Mon-Fri 8AM to 5PM  Phone: [phone] Fax: [phone]          Plan Of Care/Follow-up recommendations:  Activity:  full  Berenize Gatlin, MD 07/17/2019, 7:53 AM

## 2019-07-17 NOTE — Progress Notes (Signed)
Pt discharged to lobby. Pt was stable and appreciative at that time. All papers and prescriptions were given and valuables returned. Verbal understanding expressed. Denies SI/HI and A/VH. Pt given opportunity to express concerns and ask questions.  

## 2019-07-17 NOTE — Progress Notes (Addendum)
  [facility] Adult Case Management Discharge Plan :  Will you be returning to the same living situation after discharge:  Yes,  home. At discharge, do you have transportation home?: Yes,  aunt will pick up after 12:00pm. Do you have the ability to pay for your medications: Yes,  has Medicaid.  Release of information consent forms completed and in the chart; letter on chart.  Patient to Follow up at: Follow-up Information    [facility]. Go on 07/20/2019.   Why: You are scheduled for an appointment on 07/20/19 at 1:00 pm.  This will be a 2 hour appointment.  Please bring your insurance card, photo ID, financial information and your discharge summary with you.  Contact information: [address] [place], [place] [address] Hours: Mon-Fri 8AM to 5PM  Phone: [phone] Fax: [phone]          Next level of care provider has access to [facility] Link:no  Safety Planning and Suicide Prevention discussed: Yes,  with mother.  Has patient been referred to the Quitline?: N/A patient is not a smoker  Patient has been referred for addiction treatment: Yes  Darreld Mclean, LCSWA 07/17/2019, 9:18 AM

## 2019-07-20 ENCOUNTER — Encounter: Payer: Self-pay | Admitting: Emergency Medicine

## 2021-08-02 IMAGING — US US ABDOMEN LIMITED
1 series · 14 of 25 positions shown · non-contrast
Comparison: None.

CLINICAL DATA: Right upper quadrant pain

EXAM:
ULTRASOUND ABDOMEN LIMITED RIGHT UPPER QUADRANT

[Series 1: us abdomen limited · 14 of 80 slices shown]
[im 1/80]
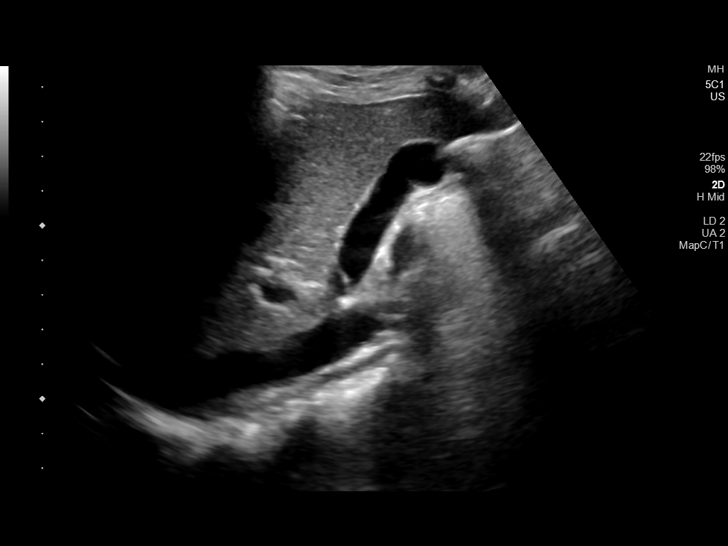
[im 7/80]
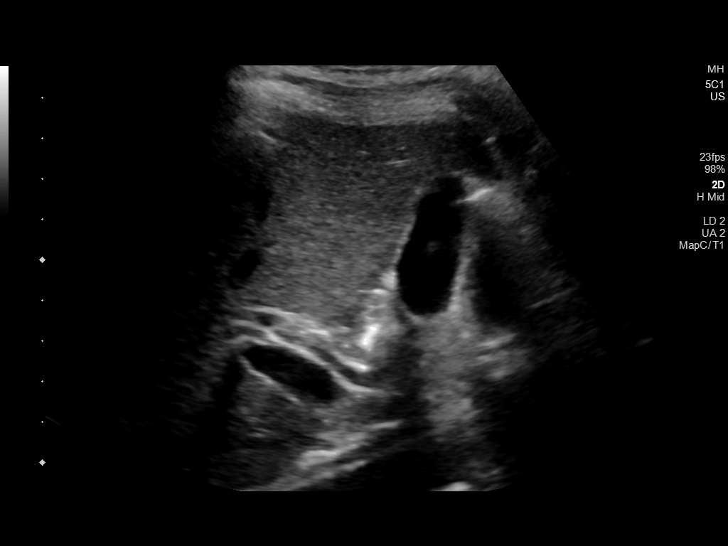
[im 14/80]
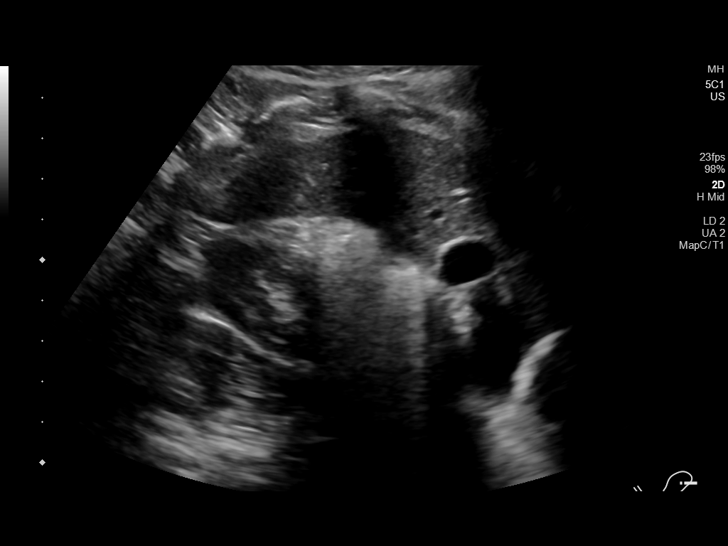
[im 20/80]
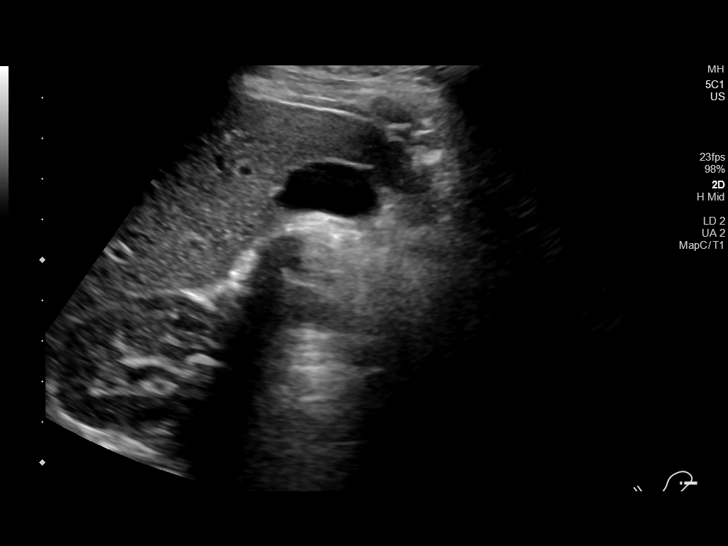
[im 27/80]
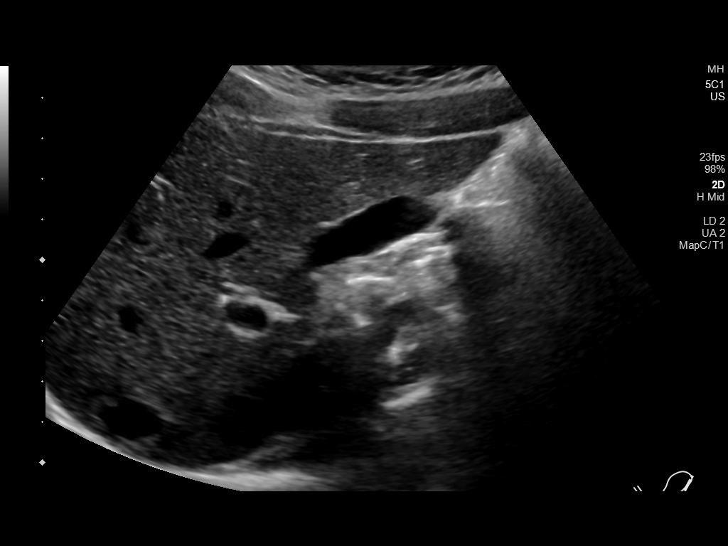
[im 30/80]
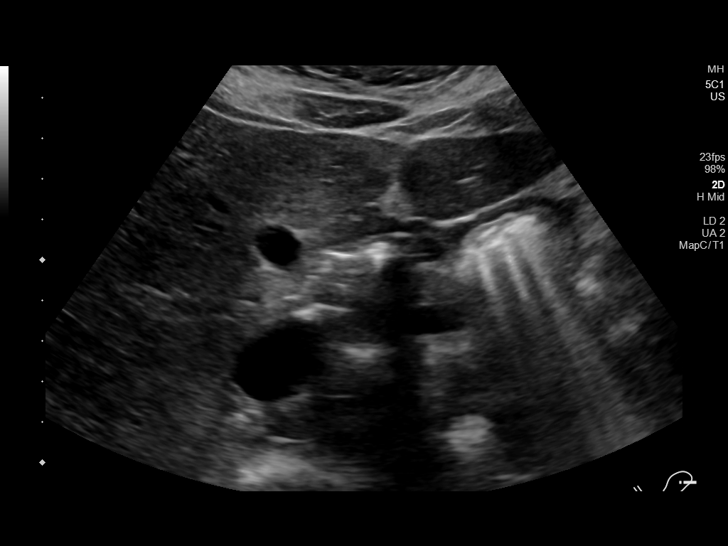
[im 37/80]
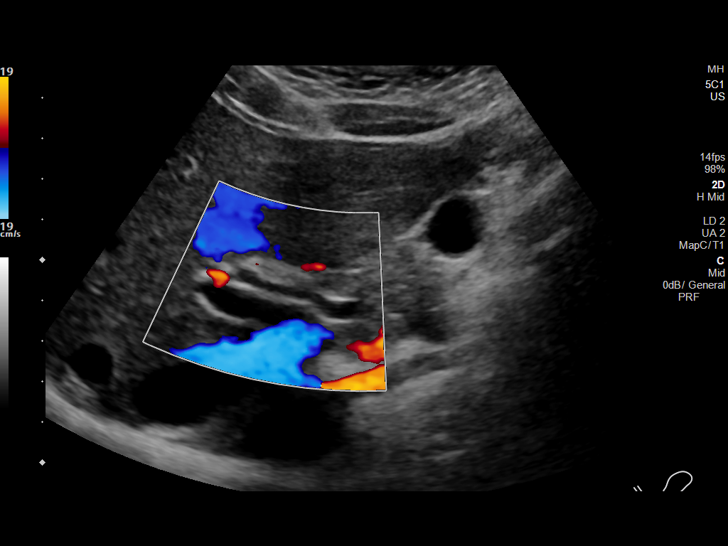
[im 43/80]
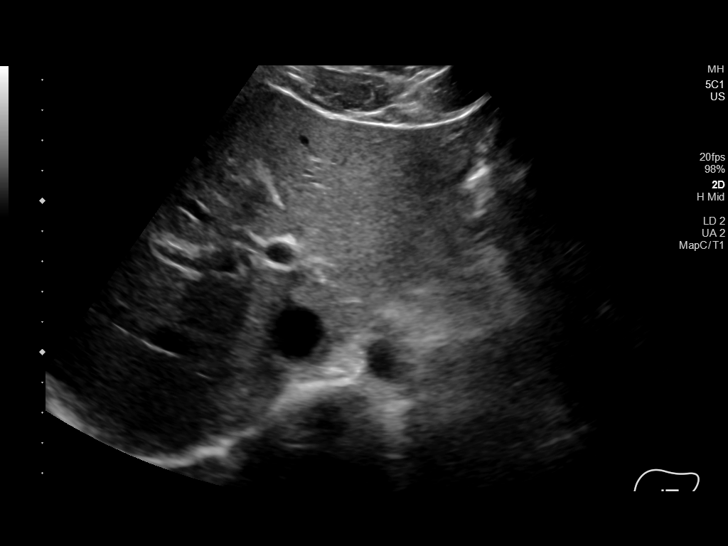
[im 50/80]
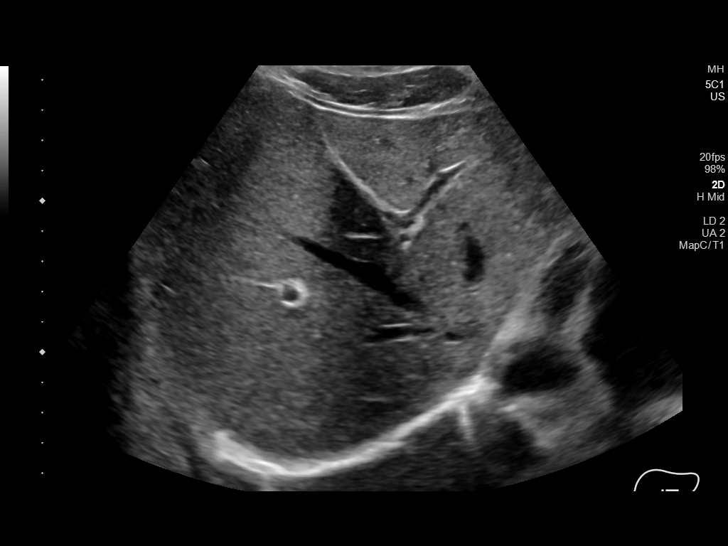
[im 53/80]
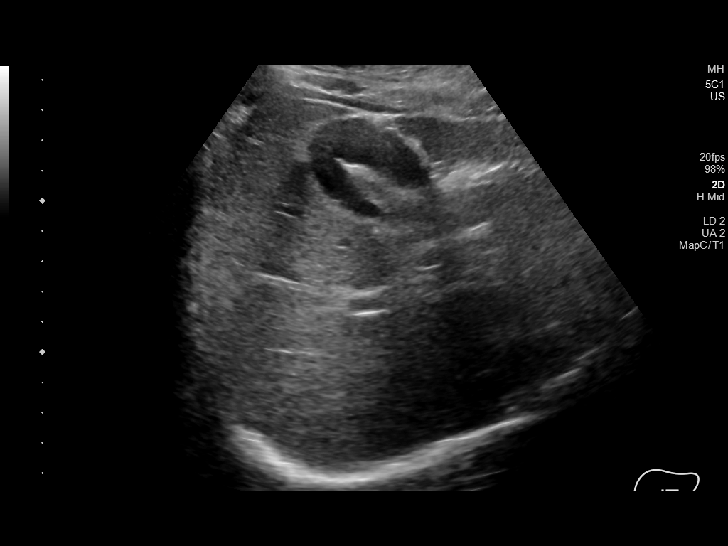
[im 60/80]
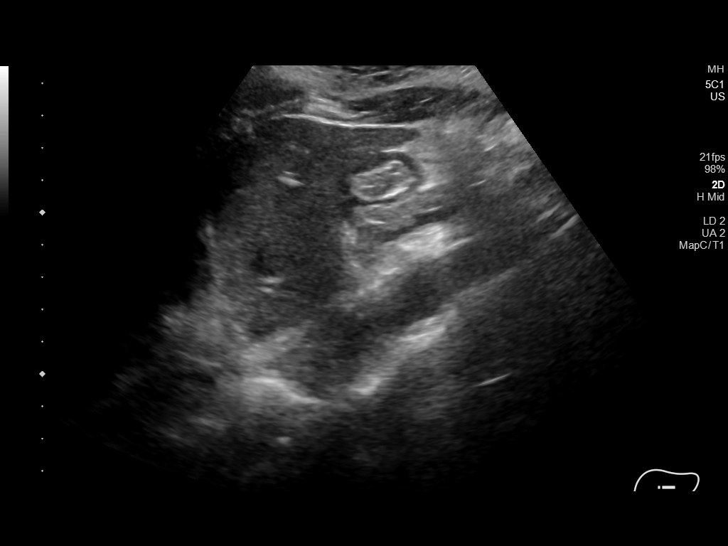
[im 66/80]
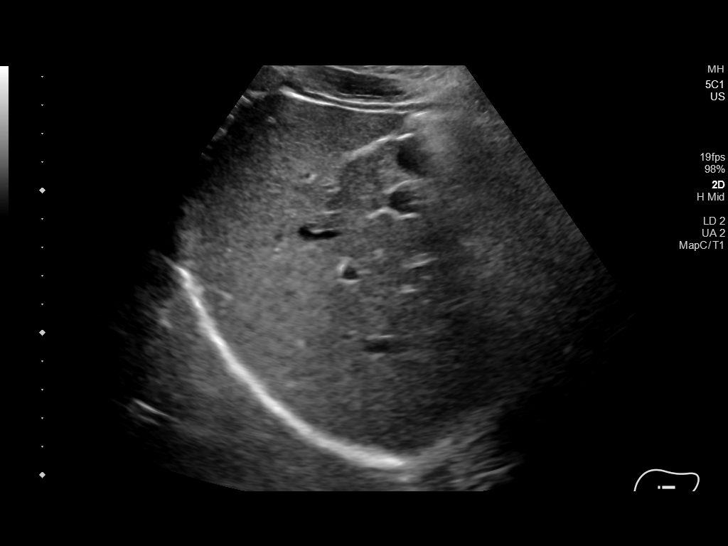
[im 73/80]
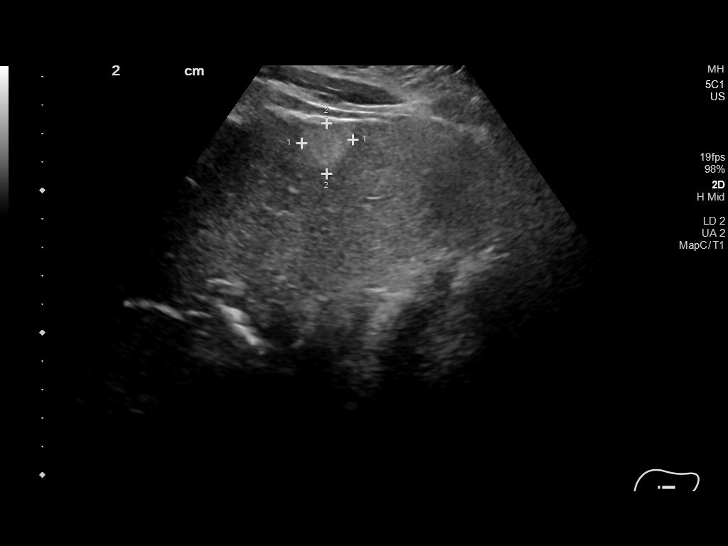
[im 80/80]
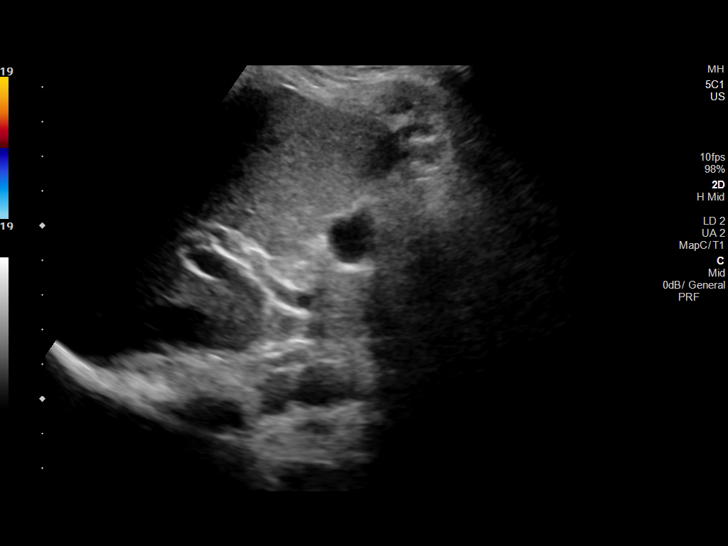

[14 of 25 positions shown; findings below may reference images not displayed]

FINDINGS: Gallbladder:

No gallstones or wall thickening visualized. No sonographic Murphy
sign noted by sonographer.

Common bile duct:

Diameter: 3.6 mm

Liver:

Echogenic mass within the anterior subcapsular liver measuring 1.8 x
1.8 x 2.2 cm. Portal vein is patent on color Doppler imaging with
normal direction of blood flow towards the liver.

Other: None.
IMPRESSION: 1. Negative for gallstones or biliary dilatation.
2. 2.2 cm echogenic liver mass, possible hemangioma. When the
patient is clinically stable and able to follow directions and hold
their breath (preferably as an outpatient) further evaluation with
dedicated abdominal MRI should be considered.
